# Patient Record
Sex: Female | Born: 1950 | Race: White | Hispanic: No | Smoking: Former smoker
Health system: Southern US, Community
[De-identification: ages and names within clinical notes are randomized; demographics above are authoritative.]

## PROBLEM LIST (undated history)

## (undated) DIAGNOSIS — Z780 Asymptomatic menopausal state: Secondary | ICD-10-CM

## (undated) DIAGNOSIS — F419 Anxiety disorder, unspecified: Secondary | ICD-10-CM

## (undated) DIAGNOSIS — F988 Other specified behavioral and emotional disorders with onset usually occurring in childhood and adolescence: Secondary | ICD-10-CM

## (undated) DIAGNOSIS — K219 Gastro-esophageal reflux disease without esophagitis: Secondary | ICD-10-CM

## (undated) DIAGNOSIS — F909 Attention-deficit hyperactivity disorder, unspecified type: Secondary | ICD-10-CM

## (undated) DIAGNOSIS — E785 Hyperlipidemia, unspecified: Secondary | ICD-10-CM

## (undated) DIAGNOSIS — I639 Cerebral infarction, unspecified: Secondary | ICD-10-CM

## (undated) HISTORY — PX: OTHER SURGICAL HISTORY: SHX169

## (undated) HISTORY — DX: Attention-deficit hyperactivity disorder, unspecified type: F90.9

## (undated) HISTORY — PX: WRIST SURGERY: SHX841

## (undated) HISTORY — DX: Anxiety disorder, unspecified: F41.9

## (undated) HISTORY — DX: Cerebral infarction, unspecified: I63.9

## (undated) HISTORY — DX: Gastro-esophageal reflux disease without esophagitis: K21.9

## (undated) HISTORY — DX: Asymptomatic menopausal state: Z78.0

## (undated) HISTORY — DX: Hyperlipidemia, unspecified: E78.5

## (undated) HISTORY — PX: KNEE ARTHROSCOPY: SHX127

## (undated) HISTORY — DX: Other specified behavioral and emotional disorders with onset usually occurring in childhood and adolescence: F98.8

---

## 1991-11-24 HISTORY — PX: GALLBLADDER SURGERY: SHX652

## 2001-05-24 ENCOUNTER — Other Ambulatory Visit: Admission: RE | Admit: 2001-05-24 | Discharge: 2001-05-24 | Payer: Self-pay | Admitting: Obstetrics and Gynecology

## 2002-08-16 ENCOUNTER — Other Ambulatory Visit: Admission: RE | Admit: 2002-08-16 | Discharge: 2002-08-16 | Payer: Self-pay | Admitting: Obstetrics and Gynecology

## 2003-10-10 ENCOUNTER — Other Ambulatory Visit: Admission: RE | Admit: 2003-10-10 | Discharge: 2003-10-10 | Payer: Self-pay | Admitting: Obstetrics and Gynecology

## 2005-06-30 ENCOUNTER — Other Ambulatory Visit: Admission: RE | Admit: 2005-06-30 | Discharge: 2005-06-30 | Payer: Self-pay | Admitting: Obstetrics and Gynecology

## 2005-09-16 ENCOUNTER — Encounter: Admission: RE | Admit: 2005-09-16 | Discharge: 2005-09-16 | Payer: Self-pay | Admitting: Family Medicine

## 2006-07-02 ENCOUNTER — Other Ambulatory Visit: Admission: RE | Admit: 2006-07-02 | Discharge: 2006-07-02 | Payer: Self-pay | Admitting: Obstetrics and Gynecology

## 2007-02-10 ENCOUNTER — Ambulatory Visit: Payer: Self-pay | Admitting: Vascular Surgery

## 2007-02-10 ENCOUNTER — Encounter: Payer: Self-pay | Admitting: Vascular Surgery

## 2007-02-10 ENCOUNTER — Ambulatory Visit: Admission: RE | Admit: 2007-02-10 | Discharge: 2007-02-10 | Payer: Self-pay | Admitting: Family Medicine

## 2007-03-15 ENCOUNTER — Ambulatory Visit (HOSPITAL_COMMUNITY): Admission: RE | Admit: 2007-03-15 | Discharge: 2007-03-15 | Payer: Self-pay | Admitting: Family Medicine

## 2007-03-15 ENCOUNTER — Encounter: Payer: Self-pay | Admitting: Vascular Surgery

## 2008-08-16 ENCOUNTER — Emergency Department (HOSPITAL_BASED_OUTPATIENT_CLINIC_OR_DEPARTMENT_OTHER): Admission: EM | Admit: 2008-08-16 | Discharge: 2008-08-16 | Payer: Self-pay | Admitting: Emergency Medicine

## 2008-11-23 HISTORY — PX: MOUTH SURGERY: SHX715

## 2009-04-07 ENCOUNTER — Emergency Department (HOSPITAL_BASED_OUTPATIENT_CLINIC_OR_DEPARTMENT_OTHER): Admission: EM | Admit: 2009-04-07 | Discharge: 2009-04-07 | Payer: Self-pay | Admitting: Emergency Medicine

## 2009-06-03 ENCOUNTER — Encounter: Admission: RE | Admit: 2009-06-03 | Discharge: 2009-06-03 | Payer: Self-pay | Admitting: Sports Medicine

## 2009-06-25 ENCOUNTER — Ambulatory Visit (HOSPITAL_COMMUNITY): Admission: RE | Admit: 2009-06-25 | Discharge: 2009-06-25 | Payer: Self-pay | Admitting: Orthopedic Surgery

## 2009-12-31 ENCOUNTER — Other Ambulatory Visit: Admission: RE | Admit: 2009-12-31 | Discharge: 2009-12-31 | Payer: Self-pay | Admitting: Family Medicine

## 2010-11-23 HISTORY — PX: FOOT SURGERY: SHX648

## 2012-05-30 ENCOUNTER — Ambulatory Visit: Payer: Self-pay | Admitting: Internal Medicine

## 2012-06-01 ENCOUNTER — Encounter: Payer: Self-pay | Admitting: Internal Medicine

## 2012-06-01 ENCOUNTER — Other Ambulatory Visit: Payer: Self-pay | Admitting: Internal Medicine

## 2012-06-01 ENCOUNTER — Ambulatory Visit (INDEPENDENT_AMBULATORY_CARE_PROVIDER_SITE_OTHER): Payer: 59 | Admitting: Internal Medicine

## 2012-06-01 VITALS — BP 138/88 | HR 80 | Temp 98.7°F | Resp 16 | Ht 65.0 in | Wt 204.0 lb

## 2012-06-01 DIAGNOSIS — F411 Generalized anxiety disorder: Secondary | ICD-10-CM

## 2012-06-01 DIAGNOSIS — I8390 Asymptomatic varicose veins of unspecified lower extremity: Secondary | ICD-10-CM | POA: Insufficient documentation

## 2012-06-01 DIAGNOSIS — Z780 Asymptomatic menopausal state: Secondary | ICD-10-CM

## 2012-06-01 DIAGNOSIS — E785 Hyperlipidemia, unspecified: Secondary | ICD-10-CM | POA: Insufficient documentation

## 2012-06-01 DIAGNOSIS — F419 Anxiety disorder, unspecified: Secondary | ICD-10-CM

## 2012-06-01 DIAGNOSIS — K219 Gastro-esophageal reflux disease without esophagitis: Secondary | ICD-10-CM

## 2012-06-01 DIAGNOSIS — E119 Type 2 diabetes mellitus without complications: Secondary | ICD-10-CM

## 2012-06-01 DIAGNOSIS — E139 Other specified diabetes mellitus without complications: Secondary | ICD-10-CM

## 2012-06-01 DIAGNOSIS — M1710 Unilateral primary osteoarthritis, unspecified knee: Secondary | ICD-10-CM

## 2012-06-01 LAB — LIPID PANEL
Cholesterol: 238 mg/dL — ABNORMAL HIGH (ref 0–200)
HDL: 37 mg/dL — ABNORMAL LOW (ref >39)
LDL Cholesterol: 172 mg/dL — ABNORMAL HIGH (ref 0–99)
Total CHOL/HDL Ratio: 6.4 Ratio
Triglycerides: 147 mg/dL (ref <150)
VLDL: 29 mg/dL (ref 0–40)

## 2012-06-01 LAB — CBC WITH DIFFERENTIAL/PLATELET
Basophils Absolute: 0.1 10*3/uL (ref 0.0–0.1)
Basophils Relative: 1 % (ref 0–1)
Eosinophils Absolute: 0.2 10*3/uL (ref 0.0–0.7)
Eosinophils Relative: 3 % (ref 0–5)
HCT: 41.5 % (ref 36.0–46.0)
Hemoglobin: 14.2 g/dL (ref 12.0–15.0)
Lymphocytes Relative: 33 % (ref 12–46)
Lymphs Abs: 2 10*3/uL (ref 0.7–4.0)
MCH: 28.7 pg (ref 26.0–34.0)
MCHC: 34.2 g/dL (ref 30.0–36.0)
MCV: 84 fL (ref 78.0–100.0)
Monocytes Absolute: 0.5 10*3/uL (ref 0.1–1.0)
Monocytes Relative: 7 % (ref 3–12)
Neutro Abs: 3.5 10*3/uL (ref 1.7–7.7)
Neutrophils Relative %: 56 % (ref 43–77)
Platelets: 307 10*3/uL (ref 150–400)
RBC: 4.94 MIL/uL (ref 3.87–5.11)
RDW: 13.9 % (ref 11.5–15.5)
WBC: 6.2 10*3/uL (ref 4.0–10.5)

## 2012-06-01 LAB — COMPLETE METABOLIC PANEL WITH GFR
ALT: 18 U/L (ref 0–35)
AST: 24 U/L (ref 0–37)
Albumin: 4 g/dL (ref 3.5–5.2)
Alkaline Phosphatase: 55 U/L (ref 39–117)
BUN: 11 mg/dL (ref 6–23)
CO2: 25 mEq/L (ref 19–32)
Calcium: 9.1 mg/dL (ref 8.4–10.5)
Chloride: 105 mEq/L (ref 96–112)
Creat: 0.94 mg/dL (ref 0.50–1.10)
GFR, Est African American: 77 mL/min
GFR, Est Non African American: 67 mL/min
Glucose, Bld: 174 mg/dL — ABNORMAL HIGH (ref 70–99)
Potassium: 4.2 mEq/L (ref 3.5–5.3)
Sodium: 139 mEq/L (ref 135–145)
Total Bilirubin: 0.3 mg/dL (ref 0.3–1.2)
Total Protein: 7.2 g/dL (ref 6.0–8.3)

## 2012-06-01 LAB — TSH: TSH: 1.204 u[IU]/mL (ref 0.350–4.500)

## 2012-06-01 NOTE — Patient Instructions (Addendum)
Dr. Caffie Pinto podiatrist  On [address]  Labs will be mailed to you

## 2012-06-01 NOTE — Progress Notes (Signed)
  Subjective:    Patient ID: Wendy Underwood, female    DOB: 1951-10-25, 61 y.o.   MRN: [id number]  HPI New pt. Here to establish prinary care.  PMH of anxiety/depression, ADD diagnosed by Dr. Candis Schatz, recent DX of diabetes type 2, Gerd and hyperlipidemia.  She is undergoing study for DM so does not know what med she is on or placebo    She is wondering if she is a good candidate for hormone therapy and states she is "foggy"  She has seen two alternative MD's in past for hormones.  She also went to [facility] for hormone pellets and was told her "Blood count was high and she should go donate blood and not tell them why she needs to donate"  Only had one hormone pellet.    She is concerned over hair loss and dark hairs on chin.  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  Constitutional: She is oriented to person, place, and time. She appears well-developed and well-nourished.  HENT:  Head: Normocephalic and atraumatic.  Cardiovascular: Normal rate and regular rhythm. Exam reveals no gallop and no friction rub.  No murmur heard.  Pulmonary/Chest: Breath sounds normal. She has no wheezes. She has no rales.  Neurological: She is alert and oriented to person, place, and time.  Skin: Skin is warm and dry.  Psychiatric: She has a normal mood and affect. Her behavior is normal.              Assessment & Plan:  DM  willl   Check AIC today.  And chemistries  Hyperlipidemia  Check today  Hair changes.  GAve number to dermatologist  Menopause   ADvised she is not a good candidate for HT  Hair changes are not an indication and she has had DVT x2 per her report.  Anxiety/depression/ADD work stress all  reason for "fogginess"  Advised to discuss with psychiatrist and therapist

## 2012-06-02 ENCOUNTER — Telehealth: Payer: Self-pay | Admitting: *Deleted

## 2012-06-02 LAB — HEMOGLOBIN A1C
Hgb A1c MFr Bld: 7.3 % — ABNORMAL HIGH (ref <5.7)
Mean Plasma Glucose: 163 mg/dL — ABNORMAL HIGH (ref <117)

## 2012-06-02 LAB — VITAMIN D 25 HYDROXY (VIT D DEFICIENCY, FRACTURES): Vit D, 25-Hydroxy: 28 ng/mL — ABNORMAL LOW (ref 30–89)

## 2012-06-02 NOTE — Telephone Encounter (Signed)
Copy of labs mailed to pt's home address along with an appt with Dr Coralyn Mark for Aug.

## 2012-07-04 ENCOUNTER — Encounter: Payer: Self-pay | Admitting: Internal Medicine

## 2012-07-04 ENCOUNTER — Ambulatory Visit (INDEPENDENT_AMBULATORY_CARE_PROVIDER_SITE_OTHER): Payer: 59 | Admitting: Internal Medicine

## 2012-07-04 VITALS — BP 136/90 | HR 92 | Temp 97.0°F | Resp 16 | Ht 65.0 in | Wt 204.0 lb

## 2012-07-04 DIAGNOSIS — Z86718 Personal history of other venous thrombosis and embolism: Secondary | ICD-10-CM

## 2012-07-04 DIAGNOSIS — R739 Hyperglycemia, unspecified: Secondary | ICD-10-CM

## 2012-07-04 DIAGNOSIS — R7309 Other abnormal glucose: Secondary | ICD-10-CM

## 2012-07-04 LAB — POCT CBG (FASTING - GLUCOSE)-MANUAL ENTRY: Glucose Fasting, POC: 256 mg/dL — AB (ref 70–99)

## 2012-07-04 NOTE — Progress Notes (Signed)
Subjective:    Patient ID: Wendy Underwood, female    DOB: [birth year], 61 y.o.   MRN: [id number]  HPI  [name] is here for return  See labs.  She has polyuria during day only.  Some polyphagia.  She is still involved with study with [employer]  She reports she is intolerant of statins had tried two different kinds and had muslce aches in her legs. Niacin intolerance due to skin rash and flushing    No Known Allergies Past Medical History  Diagnosis Date  . Anxiety   . Diabetes mellitus   . GERD (gastroesophageal reflux disease)   . Hyperlipidemia   . Menopause   . ADD (attention deficit disorder)   . ADHD (attention deficit hyperactivity disorder)    Past Surgical History  Procedure Date  . Mouth surgery 2010  . Gallbladder surgery 1993  . Varicose veins 2011and 1991  . Knee arthroscopy 2008 and 2009    both knees  . Wrist surgery     cyst removal  . Foot surgery 2012   History   Social History  . Marital Status: Divorced    Spouse Name: N/A    Number of Children: N/A  . Years of Education: N/A   Occupational History  . Not on file.   Social History Main Topics  . Smoking status: Former Smoker -- 10.5 packs/day for 30 years    Types: Cigarettes    Quit date: 06/02/2007  . Smokeless tobacco: Never Used  . Alcohol Use: No  . Drug Use: No  . Sexually Active: No   Other Topics Concern  . Not on file   Social History Narrative  . No narrative on file   Family History  Problem Relation Age of Onset  . Heart attack Maternal Grandfather   . Heart attack Father   . Cancer Mother     skin  . Cancer Maternal Uncle     skin  . Emphysema Maternal Aunt   . Schizophrenia Mother   . Depression Mother   . ADD / ADHD Son   . Diabetes Mother   . Hypertension Mother   . Hypertension Maternal Grandfather    Patient Active Problem List  Diagnosis  . Anxiety  . Diabetes 1.5, managed as type 1  . GERD (gastroesophageal reflux disease)  . Other and unspecified  hyperlipidemia  . Menopause  . Varicose veins  . DJD (degenerative joint disease) of knee   Current Outpatient Prescriptions on File Prior to Visit  Medication Sig Dispense Refill  . ALPRAZolam (XANAX) 0.25 MG tablet       . amphetamine-dextroamphetamine (ADDERALL) 10 MG tablet       . buPROPion (WELLBUTRIN SR) 150 MG 12 hr tablet       . Multiple Vitamin (MULTIVITAMIN) tablet Take 1 tablet by mouth daily.      . pantoprazole (PROTONIX) 40 MG tablet       . VITAMIN D, CHOLECALCIFEROL, PO Take by mouth.      . zolpidem (AMBIEN) 10 MG tablet           Review of Systems    see HPI Objective:   Physical Exam Physical Exam  Nursing note and vitals reviewed.  Constitutional: She is oriented to person, place, and time. She appears well-developed and well-nourished.  HENT:  Head: Normocephalic and atraumatic.  Cardiovascular: Normal rate and regular rhythm. Exam reveals no gallop and no friction rub.  No murmur heard.  Pulmonary/Chest: Breath sounds normal. She  has no wheezes. She has no rales.  Neurological: She is alert and oriented to person, place, and time.  Skin: Skin is warm and dry.  Psychiatric: She has a normal mood and affect. Her behavior is normal.  Ext no edema          Assessment & Plan:  Diabetes:    I would like to start pt on Metformin today but she declines as she has two weeks of study to complete.   Will get fasting level and AIC  She is to see me in 3 days and I counseled I will need to put her on a med then.    Hyperlipidemia  Intolerant  Of statins will refer to endocrinology  History of DVT x 2  Menopause

## 2012-07-04 NOTE — Patient Instructions (Addendum)
Have fasting labs done  See me on Thursday

## 2012-07-06 LAB — HEMOGLOBIN A1C
Hgb A1c MFr Bld: 7.1 % — ABNORMAL HIGH (ref <5.7)
Mean Plasma Glucose: 157 mg/dL — ABNORMAL HIGH (ref <117)

## 2012-07-07 ENCOUNTER — Ambulatory Visit (INDEPENDENT_AMBULATORY_CARE_PROVIDER_SITE_OTHER): Payer: 59 | Admitting: Internal Medicine

## 2012-07-07 ENCOUNTER — Encounter: Payer: Self-pay | Admitting: Internal Medicine

## 2012-07-07 VITALS — BP 136/70 | HR 91 | Temp 97.0°F | Resp 16 | Ht 65.0 in | Wt 206.0 lb

## 2012-07-07 DIAGNOSIS — E669 Obesity, unspecified: Secondary | ICD-10-CM

## 2012-07-07 DIAGNOSIS — E119 Type 2 diabetes mellitus without complications: Secondary | ICD-10-CM

## 2012-07-07 DIAGNOSIS — E139 Other specified diabetes mellitus without complications: Secondary | ICD-10-CM

## 2012-07-07 MED ORDER — METFORMIN HCL ER (MOD) 500 MG PO TB24: 500.0000 mg | ORAL_TABLET | Freq: Every day | ORAL | Status: DC

## 2012-07-07 NOTE — Patient Instructions (Addendum)
Check glucose before breakfast and before bedtime and bring to next visit

## 2012-07-07 NOTE — Progress Notes (Signed)
Subjective:    Patient ID: Wendy Underwood, female    DOB: 1951-05-07, 61 y.o.   MRN: [id number]  HPI Jennelle is here for follow up of new onset diabetes.  See AIC.    Long counseling session regarding etiology of DM, and signs and symptoms of high and low blood sugar.  See my office note 2 days ago  "I am a sweetaholic"  No Known Allergies Past Medical History  Diagnosis Date  . Anxiety   . Diabetes mellitus   . GERD (gastroesophageal reflux disease)   . Hyperlipidemia   . Menopause   . ADD (attention deficit disorder)   . ADHD (attention deficit hyperactivity disorder)    Past Surgical History  Procedure Date  . Mouth surgery 2010  . Gallbladder surgery 1993  . Varicose veins 2011and 1991  . Knee arthroscopy 2008 and 2009    both knees  . Wrist surgery     cyst removal  . Foot surgery 2012   History   Social History  . Marital Status: Divorced    Spouse Name: N/A    Number of Children: N/A  . Years of Education: N/A   Occupational History  . Not on file.   Social History Main Topics  . Smoking status: Former Smoker -- 10.5 packs/day for 30 years    Types: Cigarettes    Quit date: 06/02/2007  . Smokeless tobacco: Never Used  . Alcohol Use: No  . Drug Use: No  . Sexually Active: No   Other Topics Concern  . Not on file   Social History Narrative  . No narrative on file   Family History  Problem Relation Age of Onset  . Heart attack Maternal Grandfather   . Heart attack Father   . Cancer Mother     skin  . Cancer Maternal Uncle     skin  . Emphysema Maternal Aunt   . Schizophrenia Mother   . Depression Mother   . ADD / ADHD Son   . Diabetes Mother   . Hypertension Mother   . Hypertension Maternal Grandfather    Patient Active Problem List  Diagnosis  . Anxiety  . GERD (gastroesophageal reflux disease)  . Other and unspecified hyperlipidemia  . Menopause  . Varicose veins  . DJD (degenerative joint disease) of knee  . Personal history  of DVT (deep vein thrombosis)  . Diabetes 1.5, managed as type 2   Current Outpatient Prescriptions on File Prior to Visit  Medication Sig Dispense Refill  . ALPRAZolam (XANAX) 0.25 MG tablet       . amphetamine-dextroamphetamine (ADDERALL) 10 MG tablet       . buPROPion (WELLBUTRIN SR) 150 MG 12 hr tablet       . Multiple Vitamin (MULTIVITAMIN) tablet Take 1 tablet by mouth daily.      . pantoprazole (PROTONIX) 40 MG tablet       . VITAMIN D, CHOLECALCIFEROL, PO Take by mouth.      . zolpidem (AMBIEN) 10 MG tablet       . metFORMIN (GLUMETZA) 500 MG (MOD) 24 hr tablet Take 1 tablet (500 mg total) by mouth daily with breakfast.  90 tablet  1      Review of Systems    see HPI Objective:   Physical Exam Physical Exam  Nursing note and vitals reviewed.  Constitutional: She is oriented to person, place, and time. She appears well-developed and well-nourished.  HENT:  Head: Normocephalic and atraumatic.  Cardiovascular:  Normal rate and regular rhythm. Exam reveals no gallop and no friction rub.  No murmur heard.  Pulmonary/Chest: Breath sounds normal. She has no wheezes. She has no rales.  Neurological: She is alert and oriented to person, place, and time.  Skin: Skin is warm and dry.  Psychiatric: She has a normal mood and affect. Her behavior is normal.              Assessment & Plan:  New onset diabetes type II  Will initiate Metformin ER 500 mg in am.  Given handout of signs to look for with low sugar.  Refer to Diabetes Education  Obesity  enocurage low carb low fat diet  I spent 30 minutes with pt  Over 50% in counseling  See ne 4-6 weeks

## 2012-07-14 ENCOUNTER — Other Ambulatory Visit: Payer: Self-pay | Admitting: *Deleted

## 2012-07-14 MED ORDER — METFORMIN HCL ER (MOD) 500 MG PO TB24: 500.0000 mg | ORAL_TABLET | Freq: Every day | ORAL | Status: DC

## 2012-07-18 ENCOUNTER — Other Ambulatory Visit: Payer: Self-pay | Admitting: *Deleted

## 2012-07-18 MED ORDER — METFORMIN HCL ER (MOD) 500 MG PO TB24: 500.0000 mg | ORAL_TABLET | Freq: Every day | ORAL | Status: DC

## 2012-08-03 ENCOUNTER — Other Ambulatory Visit: Payer: Self-pay | Admitting: Internal Medicine

## 2012-08-03 NOTE — Telephone Encounter (Signed)
Pt needs her authorization to e scripts of MetFORMIN HCl (Tablet SR 24 hr) GLUMETZA 500 MG (MOD) Take 1 tablet (500 mg total) by mouth daily with breakfast.  She states that they have touch bases with [place] and are waiting on [place] to respond. Pt states she is almost out of her meds that she got from the pharmacy a month ago... Pt ask to call her if there are any questions [phone].Marland KitchenMarland Kitchen

## 2012-08-04 MED ORDER — METFORMIN HCL ER (MOD) 500 MG PO TB24: 500.0000 mg | ORAL_TABLET | Freq: Every day | ORAL | Status: DC

## 2012-08-08 ENCOUNTER — Other Ambulatory Visit: Payer: Self-pay | Admitting: *Deleted

## 2012-08-08 NOTE — Telephone Encounter (Signed)
Wendy Underwood this was done on 9/11 check with pharmacy

## 2012-08-09 ENCOUNTER — Ambulatory Visit: Payer: 59 | Admitting: *Deleted

## 2012-08-10 NOTE — Telephone Encounter (Signed)
Refill request

## 2012-08-16 ENCOUNTER — Ambulatory Visit (INDEPENDENT_AMBULATORY_CARE_PROVIDER_SITE_OTHER): Payer: 59 | Admitting: Internal Medicine

## 2012-08-16 ENCOUNTER — Encounter: Payer: Self-pay | Admitting: Internal Medicine

## 2012-08-16 VITALS — BP 136/84 | HR 106 | Temp 98.1°F | Resp 20 | Wt 206.0 lb

## 2012-08-16 DIAGNOSIS — E139 Other specified diabetes mellitus without complications: Secondary | ICD-10-CM

## 2012-08-16 DIAGNOSIS — E119 Type 2 diabetes mellitus without complications: Secondary | ICD-10-CM

## 2012-08-16 DIAGNOSIS — Z86718 Personal history of other venous thrombosis and embolism: Secondary | ICD-10-CM

## 2012-08-16 DIAGNOSIS — E785 Hyperlipidemia, unspecified: Secondary | ICD-10-CM

## 2012-08-16 MED ORDER — METFORMIN HCL ER (MOD) 500 MG PO TB24: 500.0000 mg | ORAL_TABLET | Freq: Every day | ORAL | Status: DC

## 2012-08-16 NOTE — Progress Notes (Signed)
Subjective:    Patient ID: Wendy Underwood, female    DOB: 08-08-1951, 61 y.o.   MRN: [id number]  HPI Wendy Underwood is here for follow up after initiating [place].  She has not had any in 3-4 days as she has had problems with mail order.  She wishes to go to [facility]  Glucoses in am running 140's to 160's.  She has appt next week with dietician  Cannot tolerate statins but has just started niacin again.   No Known Allergies Past Medical History  Diagnosis Date  . Anxiety   . Diabetes mellitus   . GERD (gastroesophageal reflux disease)   . Hyperlipidemia   . Menopause   . ADD (attention deficit disorder)   . ADHD (attention deficit hyperactivity disorder)    Past Surgical History  Procedure Date  . Mouth surgery 2010  . Gallbladder surgery 1993  . Varicose veins 2011and 1991  . Knee arthroscopy 2008 and 2009    both knees  . Wrist surgery     cyst removal  . Foot surgery 2012   History   Social History  . Marital Status: Divorced    Spouse Name: N/A    Number of Children: N/A  . Years of Education: N/A   Occupational History  . Not on file.   Social History Main Topics  . Smoking status: Former Smoker -- 10.5 packs/day for 30 years    Types: Cigarettes    Quit date: 06/02/2007  . Smokeless tobacco: Never Used  . Alcohol Use: No  . Drug Use: No  . Sexually Active: No   Other Topics Concern  . Not on file   Social History Narrative  . No narrative on file   Family History  Problem Relation Age of Onset  . Heart attack Maternal Grandfather   . Heart attack Father   . Cancer Mother     skin  . Cancer Maternal Uncle     skin  . Emphysema Maternal Aunt   . Schizophrenia Mother   . Depression Mother   . ADD / ADHD Son   . Diabetes Mother   . Hypertension Mother   . Hypertension Maternal Grandfather    Patient Active Problem List  Diagnosis  . Anxiety  . GERD (gastroesophageal reflux disease)  . Other and unspecified hyperlipidemia  . Menopause  .  Varicose veins  . DJD (degenerative joint disease) of knee  . Personal history of DVT (deep vein thrombosis)  . Diabetes 1.5, managed as type 2   Current Outpatient Prescriptions on File Prior to Visit  Medication Sig Dispense Refill  . ALPRAZolam (XANAX) 0.25 MG tablet       . amphetamine-dextroamphetamine (ADDERALL) 10 MG tablet       . buPROPion (WELLBUTRIN SR) 150 MG 12 hr tablet       . Multiple Vitamin (MULTIVITAMIN) tablet Take 1 tablet by mouth daily.      . pantoprazole (PROTONIX) 40 MG tablet       . VITAMIN D, CHOLECALCIFEROL, PO Take by mouth.      . zolpidem (AMBIEN) 10 MG tablet       . DISCONTD: metFORMIN (GLUMETZA) 500 MG (MOD) 24 hr tablet Take 1 tablet (500 mg total) by mouth daily with breakfast.  90 tablet  3      Review of Systems See HPI    Objective:   Physical Exam Physical Exam  Nursing note and vitals reviewed.  Constitutional: She is oriented to person, place, and  time. She appears well-developed and well-nourished.  HENT:  Head: Normocephalic and atraumatic.  Cardiovascular: Normal rate and regular rhythm. Exam reveals no gallop and no friction rub.  No murmur heard.  Pulmonary/Chest: Breath sounds normal. She has no wheezes. She has no rales.  Neurological: She is alert and oriented to person, place, and time.  Skin: Skin is warm and dry.  Psychiatric: She has a normal mood and affect. Her behavior is normal.         Assessment & Plan:  DM:  Counseled not to run out of meds.  Nutrition appt pending.  See me 3 months  Hyperlipidemia  Will get DM under control with meds and diet and recheck fasitng levels in December  History of DVT  Diabetes visit 3 months

## 2012-08-16 NOTE — Patient Instructions (Addendum)
See me in 3 months fasting

## 2012-08-18 ENCOUNTER — Ambulatory Visit: Payer: 59 | Admitting: Internal Medicine

## 2012-10-26 NOTE — Progress Notes (Signed)
Was not able to reach until 4pm prior to surgery-will need istat and ekg-had medical clearance from Dr Tamala Julian

## 2012-10-27 ENCOUNTER — Encounter (HOSPITAL_BASED_OUTPATIENT_CLINIC_OR_DEPARTMENT_OTHER): Payer: Self-pay | Admitting: *Deleted

## 2012-10-27 ENCOUNTER — Ambulatory Visit (HOSPITAL_BASED_OUTPATIENT_CLINIC_OR_DEPARTMENT_OTHER): Payer: 59 | Admitting: *Deleted

## 2012-10-27 ENCOUNTER — Encounter (HOSPITAL_BASED_OUTPATIENT_CLINIC_OR_DEPARTMENT_OTHER)
Admission: RE | Disposition: A | Discharge status: Home / Self Care | Payer: Self-pay | Source: Ambulatory Visit | Attending: Orthopedic Surgery

## 2012-10-27 ENCOUNTER — Ambulatory Visit (HOSPITAL_BASED_OUTPATIENT_CLINIC_OR_DEPARTMENT_OTHER)
Admission: RE | Admit: 2012-10-27 | Discharge: 2012-10-27 | Disposition: A | Discharge status: Home / Self Care | Payer: 59 | Source: Ambulatory Visit | Attending: Orthopedic Surgery | Admitting: Orthopedic Surgery

## 2012-10-27 DIAGNOSIS — E119 Type 2 diabetes mellitus without complications: Secondary | ICD-10-CM | POA: Insufficient documentation

## 2012-10-27 DIAGNOSIS — M2040 Other hammer toe(s) (acquired), unspecified foot: Secondary | ICD-10-CM | POA: Insufficient documentation

## 2012-10-27 HISTORY — PX: HAMMERTOE RECONSTRUCTION WITH WEIL OSTEOTOMY: SHX5631

## 2012-10-27 LAB — POCT I-STAT, CHEM 8
BUN: 8 mg/dL (ref 6–23)
Chloride: 110 mEq/L (ref 96–112)
Creatinine, Ser: 0.8 mg/dL (ref 0.50–1.10)
Potassium: 4.2 mEq/L (ref 3.5–5.1)
Sodium: 144 mEq/L (ref 135–145)
TCO2: 24 mmol/L (ref 0–100)

## 2012-10-27 LAB — GLUCOSE, CAPILLARY: Glucose-Capillary: 229 mg/dL — ABNORMAL HIGH (ref 70–99)

## 2012-10-27 SURGERY — HAMMERTOE RECONSTRUCTION WITH WEIL OSTEOTOMY
Anesthesia: Regional | Site: Foot | Laterality: Right | Wound class: Clean

## 2012-10-27 MED ORDER — HYDROMORPHONE HCL PF 1 MG/ML IJ SOLN: 0.2500 mg | INTRAMUSCULAR | Status: DC | PRN

## 2012-10-27 MED ORDER — BUPIVACAINE HCL (PF) 0.5 % IJ SOLN
INTRAMUSCULAR | Status: DC | PRN
  Administered 2012-10-27: 10 mL

## 2012-10-27 MED ORDER — OXYCODONE HCL 5 MG/5ML PO SOLN
5.0000 mg | Freq: Once | ORAL | Status: DC | PRN
Start: 2012-10-27 — End: 2012-10-27

## 2012-10-27 MED ORDER — ACETAMINOPHEN 10 MG/ML IV SOLN
1000.0000 mg | Freq: Once | INTRAVENOUS | Status: AC
  Administered 2012-10-27: 1000 mg via INTRAVENOUS

## 2012-10-27 MED ORDER — OXYCODONE HCL 5 MG PO TABS: 5.0000 mg | ORAL_TABLET | ORAL | Status: DC | PRN

## 2012-10-27 MED ORDER — 0.9 % SODIUM CHLORIDE (POUR BTL) OPTIME
TOPICAL | Status: DC | PRN
  Administered 2012-10-27: 300 mL

## 2012-10-27 MED ORDER — BUPIVACAINE-EPINEPHRINE PF 0.5-1:200000 % IJ SOLN
INTRAMUSCULAR | Status: DC | PRN
  Administered 2012-10-27: 30 mL

## 2012-10-27 MED ORDER — PROPOFOL 10 MG/ML IV BOLUS
INTRAVENOUS | Status: DC | PRN
  Administered 2012-10-27: 200 mg via INTRAVENOUS

## 2012-10-27 MED ORDER — CHLORHEXIDINE GLUCONATE 4 % EX LIQD: 60.0000 mL | Freq: Once | CUTANEOUS | Status: DC

## 2012-10-27 MED ORDER — SODIUM CHLORIDE 0.9 % IV SOLN: INTRAVENOUS | Status: DC

## 2012-10-27 MED ORDER — FENTANYL CITRATE 0.05 MG/ML IJ SOLN
50.0000 ug | INTRAMUSCULAR | Status: DC | PRN
  Administered 2012-10-27: 100 ug via INTRAVENOUS

## 2012-10-27 MED ORDER — MIDAZOLAM HCL 2 MG/2ML IJ SOLN
0.5000 mg | INTRAMUSCULAR | Status: DC | PRN
  Administered 2012-10-27: 2 mg via INTRAVENOUS

## 2012-10-27 MED ORDER — OXYCODONE HCL 5 MG PO TABS: 5.0000 mg | ORAL_TABLET | Freq: Once | ORAL | Status: DC | PRN

## 2012-10-27 MED ORDER — DEXAMETHASONE SODIUM PHOSPHATE 10 MG/ML IJ SOLN
INTRAMUSCULAR | Status: DC | PRN
  Administered 2012-10-27: 5 mg via INTRAVENOUS

## 2012-10-27 MED ORDER — BACITRACIN ZINC 500 UNIT/GM EX OINT
TOPICAL_OINTMENT | CUTANEOUS | Status: DC | PRN
  Administered 2012-10-27: 1 via TOPICAL

## 2012-10-27 MED ORDER — ONDANSETRON HCL 4 MG/2ML IJ SOLN
INTRAMUSCULAR | Status: DC | PRN
  Administered 2012-10-27: 4 mg via INTRAVENOUS

## 2012-10-27 MED ORDER — CEFAZOLIN SODIUM-DEXTROSE 2-3 GM-% IV SOLR
2.0000 g | INTRAVENOUS | Status: AC
  Administered 2012-10-27: 2 g via INTRAVENOUS

## 2012-10-27 MED ORDER — LIDOCAINE HCL (CARDIAC) 20 MG/ML IV SOLN
INTRAVENOUS | Status: DC | PRN
  Administered 2012-10-27: 30 mg via INTRAVENOUS

## 2012-10-27 MED ORDER — LACTATED RINGERS IV SOLN
INTRAVENOUS | Status: DC
  Administered 2012-10-27: 08:00:00 via INTRAVENOUS
  Administered 2012-10-27: 20 mL/h via INTRAVENOUS

## 2012-10-27 SURGICAL SUPPLY — 71 items
BANDAGE CONFORM 2  STR LF (GAUZE/BANDAGES/DRESSINGS) ×3 IMPLANT
BANDAGE CONFORM 3  STR LF (GAUZE/BANDAGES/DRESSINGS) IMPLANT
BANDAGE ESMARK 6X9 LF (GAUZE/BANDAGES/DRESSINGS) ×2 IMPLANT
BLADE AVERAGE 25X9 (BLADE) ×3 IMPLANT
BLADE SURG 15 STRL LF DISP TIS (BLADE) ×6 IMPLANT
BLADE SURG 15 STRL SS (BLADE) ×9
BNDG CMPR 9X4 STRL LF SNTH (GAUZE/BANDAGES/DRESSINGS)
BNDG CMPR 9X6 STRL LF SNTH (GAUZE/BANDAGES/DRESSINGS) ×2
BNDG COHESIVE 4X5 TAN STRL (GAUZE/BANDAGES/DRESSINGS) ×3 IMPLANT
BNDG ESMARK 4X9 LF (GAUZE/BANDAGES/DRESSINGS) IMPLANT
BNDG ESMARK 6X9 LF (GAUZE/BANDAGES/DRESSINGS) ×3
CAP PIN PROTECTOR ORTHO WHT (CAP) ×6 IMPLANT
CHLORAPREP W/TINT 26ML (MISCELLANEOUS) ×3 IMPLANT
CLOTH BEACON ORANGE TIMEOUT ST (SAFETY) ×3 IMPLANT
COVER TABLE BACK 60X90 (DRAPES) ×3 IMPLANT
CUFF TOURNIQUET SINGLE 18IN (TOURNIQUET CUFF) IMPLANT
CUFF TOURNIQUET SINGLE 34IN LL (TOURNIQUET CUFF) ×3 IMPLANT
DRAPE EXTREMITY T 121X128X90 (DRAPE) ×3 IMPLANT
DRAPE OEC MINIVIEW 54X84 (DRAPES) ×3 IMPLANT
DRAPE U-SHAPE 47X51 STRL (DRAPES) ×3 IMPLANT
DRSG EMULSION OIL 3X3 NADH (GAUZE/BANDAGES/DRESSINGS) ×3 IMPLANT
DRSG PAD ABDOMINAL 8X10 ST (GAUZE/BANDAGES/DRESSINGS) IMPLANT
ELECT REM PT RETURN 9FT ADLT (ELECTROSURGICAL) ×3
ELECTRODE REM PT RTRN 9FT ADLT (ELECTROSURGICAL) ×2 IMPLANT
GAUZE SPONGE 4X4 16PLY XRAY LF (GAUZE/BANDAGES/DRESSINGS) IMPLANT
GLOVE BIO SURGEON STRL SZ7 (GLOVE) ×3 IMPLANT
GLOVE BIO SURGEON STRL SZ8 (GLOVE) ×3 IMPLANT
GLOVE BIOGEL PI IND STRL 7.0 (GLOVE) ×2 IMPLANT
GLOVE BIOGEL PI IND STRL 8 (GLOVE) ×2 IMPLANT
GLOVE BIOGEL PI INDICATOR 7.0 (GLOVE) ×1
GLOVE BIOGEL PI INDICATOR 8 (GLOVE) ×1
GLOVE ECLIPSE 6.5 STRL STRAW (GLOVE) ×2 IMPLANT
GOWN PREVENTION PLUS XLARGE (GOWN DISPOSABLE) ×5 IMPLANT
GOWN PREVENTION PLUS XXLARGE (GOWN DISPOSABLE) ×3 IMPLANT
K-WIRE 102X1.4 (WIRE) ×9 IMPLANT
NEEDLE HYPO 22GX1.5 SAFETY (NEEDLE) IMPLANT
NS IRRIG 1000ML POUR BTL (IV SOLUTION) ×3 IMPLANT
PACK BASIN DAY SURGERY FS (CUSTOM PROCEDURE TRAY) ×3 IMPLANT
PAD CAST 4YDX4 CTTN HI CHSV (CAST SUPPLIES) ×2 IMPLANT
PADDING CAST ABS 4INX4YD NS (CAST SUPPLIES) ×1
PADDING CAST ABS COTTON 4X4 ST (CAST SUPPLIES) ×2 IMPLANT
PADDING CAST COTTON 4X4 STRL (CAST SUPPLIES) ×3
PASSER SUT SWANSON 36MM LOOP (INSTRUMENTS) IMPLANT
PENCIL BUTTON HOLSTER BLD 10FT (ELECTRODE) IMPLANT
SHEET MEDIUM DRAPE 40X70 STRL (DRAPES) ×3 IMPLANT
SLEEVE SCD COMPRESS KNEE MED (MISCELLANEOUS) ×3 IMPLANT
SPONGE GAUZE 4X4 12PLY (GAUZE/BANDAGES/DRESSINGS) ×3 IMPLANT
SPONGE LAP 18X18 X RAY DECT (DISPOSABLE) ×3 IMPLANT
STOCKINETTE 6  STRL (DRAPES) ×1
STOCKINETTE 6 STRL (DRAPES) ×2 IMPLANT
SUCTION FRAZIER TIP 10 FR DISP (SUCTIONS) IMPLANT
SUT ETHILON 3 0 FSL (SUTURE) ×3 IMPLANT
SUT ETHILON 3 0 PS 1 (SUTURE) ×3 IMPLANT
SUT MERSILENE 2.0 SH NDLE (SUTURE) IMPLANT
SUT MNCRL AB 3-0 PS2 18 (SUTURE) ×3 IMPLANT
SUT MNCRL AB 4-0 PS2 18 (SUTURE) IMPLANT
SUT VIC AB 2-0 CT1 27 (SUTURE)
SUT VIC AB 2-0 CT1 TAPERPNT 27 (SUTURE) IMPLANT
SUT VIC AB 2-0 SH 18 (SUTURE) IMPLANT
SUT VIC AB 2-0 SH 27 (SUTURE) ×3
SUT VIC AB 2-0 SH 27XBRD (SUTURE) ×2 IMPLANT
SUT VIC AB 3-0 PS1 18 (SUTURE) ×3
SUT VIC AB 3-0 PS1 18XBRD (SUTURE) ×1 IMPLANT
SYR BULB 3OZ (MISCELLANEOUS) ×3 IMPLANT
SYR CONTROL 10ML LL (SYRINGE) IMPLANT
TOWEL OR 17X24 6PK STRL BLUE (TOWEL DISPOSABLE) ×6 IMPLANT
TUBE CONNECTING 20X1/4 (TUBING) IMPLANT
TWIST OFF SCREW 12MM (Screw) ×3 IMPLANT
TWIST OFF SCREW 14MM (Screw) ×3 IMPLANT
UNDERPAD 30X30 INCONTINENT (UNDERPADS AND DIAPERS) ×3 IMPLANT
YANKAUER SUCT BULB TIP NO VENT (SUCTIONS) IMPLANT

## 2012-10-27 NOTE — Anesthesia Preprocedure Evaluation (Addendum)
Anesthesia Evaluation  Patient identified by MRN, date of birth, ID band Patient awake    Reviewed: Allergy & Precautions, H&P , NPO status , Patient's Chart, lab work & pertinent test results  Airway Mallampati: II TM Distance: >3 FB Neck ROM: Full    Dental No notable dental hx. (+) Implants and Dental Advisory Given   Pulmonary neg pulmonary ROS,  breath sounds clear to auscultation  Pulmonary exam normal       Cardiovascular negative cardio ROS  Rhythm:Regular Rate:Normal     Neuro/Psych PSYCHIATRIC DISORDERS negative neurological ROS     GI/Hepatic Neg liver ROS, GERD-  Controlled,  Endo/Other  diabetes, Type 2, Oral Hypoglycemic Agents  Renal/GU negative Renal ROS  negative genitourinary   Musculoskeletal   Abdominal   Peds  Hematology negative hematology ROS (+)   Anesthesia Other Findings   Reproductive/Obstetrics negative OB ROS                           Anesthesia Physical Anesthesia Plan  ASA: II  Anesthesia Plan: General and Regional   Post-op Pain Management:    Induction: Intravenous  Airway Management Planned: LMA  Additional Equipment:   Intra-op Plan:   Post-operative Plan: Extubation in OR  Informed Consent: I have reviewed the patients History and Physical, chart, labs and discussed the procedure including the risks, benefits and alternatives for the proposed anesthesia with the patient or authorized representative who has indicated his/her understanding and acceptance.   Dental advisory given  Plan Discussed with: CRNA  Anesthesia Plan Comments:         Anesthesia Quick Evaluation

## 2012-10-27 NOTE — Transfer of Care (Signed)
Immediate Anesthesia Transfer of Care Note  Patient: Wendy Underwood  Procedure(s) Performed: Procedure(s) (LRB) with comments: HAMMERTOE RECONSTRUCTION WITH WEIL OSTEOTOMY (Right) - Right Second, Third and Fouth  Hammertoe Correction; Right Second and Third Metatarsal Weil Osteotomy; Second and Third Dorsal Capsulotomy;  Second Flexor Extensor Transfer  Patient Location: PACU  Anesthesia Type:GA combined with regional for post-op pain  Level of Consciousness: awake and sedated  Airway & Oxygen Therapy: Patient Spontanous Breathing and Patient connected to face mask oxygen  Post-op Assessment: Report given to PACU RN, Post -op Vital signs reviewed and stable and Patient moving all extremities  Post vital signs: Reviewed and stable  Complications: No apparent anesthesia complications

## 2012-10-27 NOTE — Anesthesia Postprocedure Evaluation (Signed)
  Anesthesia Post-op Note  Patient: Wendy Underwood  Procedure(s) Performed: Procedure(s) (LRB) with comments: HAMMERTOE RECONSTRUCTION WITH WEIL OSTEOTOMY (Right) - Right Second, Third and Fouth  Hammertoe Correction; Right Second and Third Metatarsal Weil Osteotomy; Second and Third Dorsal Capsulotomy;  Second Flexor Extensor Transfer  Patient Location: PACU  Anesthesia Type:GA combined with regional for post-op pain  Level of Consciousness: awake and alert   Airway and Oxygen Therapy: Patient Spontanous Breathing and Patient connected to face mask oxygen  Post-op Pain: none  Post-op Assessment: Post-op Vital signs reviewed, Patient's Cardiovascular Status Stable, Respiratory Function Stable, Patent Airway and No signs of Nausea or vomiting  Post-op Vital Signs: Reviewed and stable  Complications: No apparent anesthesia complications

## 2012-10-27 NOTE — H&P (Signed)
Wendy Underwood is an 61 y.o. female.   Chief Complaint: hammertoes HPI: 61 y/o female with painful right hammertoes 2-4.  She presents now for operative treatment.  Past Medical History  Diagnosis Date  . Anxiety   . Diabetes mellitus   . GERD (gastroesophageal reflux disease)   . Hyperlipidemia   . Menopause   . ADD (attention deficit disorder)   . ADHD (attention deficit hyperactivity disorder)     Past Surgical History  Procedure Date  . Mouth surgery 2010  . Gallbladder surgery 1993  . Varicose veins 2011and 1991  . Knee arthroscopy 2008 and 2009    both knees  . Wrist surgery     cyst removal  . Foot surgery 2012    Family History  Problem Relation Age of Onset  . Heart attack Maternal Grandfather   . Heart attack Father   . Cancer Mother     skin  . Cancer Maternal Uncle     skin  . Emphysema Maternal Aunt   . Schizophrenia Mother   . Depression Mother   . ADD / ADHD Son   . Diabetes Mother   . Hypertension Mother   . Hypertension Maternal Grandfather    Social History:  reports that she quit smoking about 5 years ago. Her smoking use included Cigarettes. She has a 315 pack-year smoking history. She has never used smokeless tobacco. She reports that she does not drink alcohol or use illicit drugs.  Allergies: No Known Allergies  Medications Prior to Admission  Medication Sig Dispense Refill  . ALPRAZolam (XANAX) 0.25 MG tablet       . amphetamine-dextroamphetamine (ADDERALL) 10 MG tablet       . buPROPion (WELLBUTRIN SR) 150 MG 12 hr tablet       . colesevelam (WELCHOL) 625 MG tablet Take 1,875 mg by mouth 2 (two) times daily with a meal.      . metFORMIN (GLUMETZA) 500 MG (MOD) 24 hr tablet Take 1 tablet (500 mg total) by mouth daily with breakfast.  90 tablet  3  . Multiple Vitamin (MULTIVITAMIN) tablet Take 1 tablet by mouth daily.      . pantoprazole (PROTONIX) 40 MG tablet       . VITAMIN D, CHOLECALCIFEROL, PO Take by mouth.      . zolpidem  (AMBIEN) 10 MG tablet         Results for orders placed during the hospital encounter of 10/27/12 (from the past 48 hour(s))  POCT I-STAT, CHEM 8     Status: Abnormal   Collection Time   10/27/12  7:11 AM      Component Value Range Comment   Sodium 144  135 - 145 mEq/L    Potassium 4.2  3.5 - 5.1 mEq/L    Chloride 110  96 - 112 mEq/L    BUN 8  6 - 23 mg/dL    Creatinine, Ser 0.80  0.50 - 1.10 mg/dL    Glucose, Bld 172 (*) 70 - 99 mg/dL    Calcium, Ion 1.11 (*) 1.12 - 1.23 mmol/L    TCO2 24  0 - 100 mmol/L    Hemoglobin 13.6  12.0 - 15.0 g/dL    HCT 40.0  36.0 - 46.0 %    No results found.  ROS  No recent f/c/nv//wt los  Blood pressure 146/79, pulse 16, temperature 98.1 F (36.7 C), temperature source Oral, resp. rate 16, height 5\' 5"  (1.651 m), weight 92.08 kg (203 lb),  SpO2 99.00%. Physical Exam wn wd woman in nad.  A and O x 4.  Mood anda ffect normal.  EOMI.  Respirations unlabored.  R forefoot with 2-4 hammertoes.  Not passively correctable.  Skin healthy and intact.  Palpable pulses.  Feels LT normally.  No lymphadenopathy.  Assessment/Plan Right 2-4 hammertoes.  To OR for weil osteotomies, dorsal capsulotomies, PIP arthrodeses and possible flexor to extensor transfers.  The risks and benefits of the alternative treatment options have been discussed in detail.  The patient wishes to proceed with surgery and specifically understands risks of bleeding, infection, nerve damage, blood clots, need for additional surgery, amputation and death.   [name] 10/29/12, 7:16 AM

## 2012-10-27 NOTE — Progress Notes (Signed)
Dr Ola Spurr by to assess and check on pt.  Reported Blood sugar of 229 to him. Verbalized acknowledgement. No orders given. Pt stable nad, resting quietly otherwise.

## 2012-10-27 NOTE — Progress Notes (Signed)
Assisted Dr. Ola Spurr with right, ultrasound guided, popliteal block. Side rails up, monitors on throughout procedure. See vital signs in flow sheet. Tolerated Procedure well.

## 2012-10-27 NOTE — Anesthesia Procedure Notes (Addendum)
Anesthesia Regional Block:  Popliteal block  Pre-Anesthetic Checklist: ,, timeout performed, Correct Patient, Correct Site, Correct Laterality, Correct Procedure, Correct Position, site marked, Risks and benefits discussed, pre-op evaluation, post-op pain management  Laterality: Right  Prep: Maximum Sterile Barrier Precautions used and chloraprep       Needles:  Injection technique: Single-shot  Needle Type: Echogenic Stimulator Needle          Additional Needles:  Procedures: ultrasound guided (picture in chart) and nerve stimulator Popliteal block  Nerve Stimulator or Paresthesia:  Response: Peroneal, 0.4 mA,  Response: Tibial, 0.4 mA,   Additional Responses:   Narrative:  Start time: 10/27/2012 7:15 AM End time: 10/27/2012 7:25 AM Injection made incrementally with aspirations every 5 mL. Anesthesiologist: Ola Spurr, MD  Additional Notes: 2% Lidocaine skin wheel. Saphenous block with 5cc of 0.5% Bupivicaine plain.  Popliteal block Procedure Name: LMA Insertion Date/Time: 10/27/2012 7:39 AM Performed by: [place] Pre-anesthesia Checklist: Patient identified, Emergency Drugs available, Suction available and Patient being monitored Patient Re-evaluated:Patient Re-evaluated prior to inductionOxygen Delivery Method: Circle system utilized Preoxygenation: Pre-oxygenation with 100% oxygen Intubation Type: IV induction Ventilation: Mask ventilation without difficulty LMA: LMA inserted LMA Size: 4.0 Number of attempts: 1 Airway Equipment and Method: Bite block Placement Confirmation: breath sounds checked- equal and bilateral Tube secured with: Tape

## 2012-10-27 NOTE — Brief Op Note (Signed)
10/27/2012  9:14 AM  PATIENT:  Wendy Underwood  61 y.o. female  PRE-OPERATIVE DIAGNOSIS:  Right Second, Third and Fourth Hammertoes  POST-OPERATIVE DIAGNOSIS:  Right Second claw toe, Third and Fourth Hammertoes  Procedure(s): 1.  Right 2nd and 3rd metatarsal Weil osteotomies 2.  Right 2nd and 3rd dorsal capsulotomies with EDL and EDB lengthening 3.  Right 2nd, 3rd and 4th hammertoe corrections (PIP arthrodeses) 4.  Right 2nd flexor to extensor transfer 5.  fluoro  SURGEON:  [name], MD  ASSISTANT: n/a  ANESTHESIA:   General, regional  EBL:  minimal   TOURNIQUET:   Total Tourniquet Time Documented: Thigh (Right) - 77 minutes  COMPLICATIONS:  None apparent  DISPOSITION:  Extubated, awake and stable to recovery.  DICTATION ID:   [id number]

## 2012-10-28 NOTE — Op Note (Signed)
Wendy Underwood, Wendy Underwood            ACCOUNT NO.:  [id number]  MEDICAL RECORD NO.:  [id number]  LOCATION:                                 FACILITY:  PHYSICIAN:  [name], MD        DATE OF BIRTH:  1951/06/24  DATE OF PROCEDURE:  10/27/2012 DATE OF DISCHARGE:                              OPERATIVE REPORT   PREOPERATIVE DIAGNOSIS:  Right second, third, and fourth hammertoes.  POSTOPERATIVE DIAGNOSIS:  Right second claw toe, and third and fourth hammertoes.  PROCEDURES: 1. Right second and third metatarsal Weil osteotomies. 2. Right second and third dorsal capsulotomies with extensor digitorum     longus and extensor digitorum brevis lengthening. 3. Right second, third and fourth hammertoe corrections (PIP     arthrodesis). 4. Right second toe flexor to extensor transfer. 5. Intraoperative interpretation of fluoroscopic imaging.  SURGEON:  [name], MD  ANESTHESIA:  General, regional.  ESTIMATED BLOOD LOSS:  Minimal.  TOURNIQUET TIME:  77 minutes at 250 mmHg.  COMPLICATIONS:  None apparent.  DISPOSITION:  Extubated, awake and stable to recovery.  INDICATIONS FOR PROCEDURE:  The patient is a 61 year old woman with a painful right forefoot deformity of second, third, and fourth hammertoes.  She presents now for operative treatment of this condition, having failed nonoperative treatment including activity modification, shoe wear modification, toe spacers, oral anti-inflammatories, and previous Morton's neuroma resection.  She understands the risks and benefits, the alternative treatment options, and elects surgical treatment.  She specifically understands risks of bleeding, infection, nerve damage, blood clots, need for additional surgery, amputation, and death.  PROCEDURE IN DETAIL:  After preoperative consent was obtained and the correct operative site was identified, the patient was brought to the operating room and placed supine on the operating table.   General anesthesia was induced.  Preoperative antibiotics were administered. Surgical time-out was taken.  The right lower extremity was prepped and draped in standard sterile fashion with tourniquet around the thigh. The extremity was exsanguinated and the tourniquet was inflated to 250 mmHg.  The dorsal incisions were marked on the skin at the second MTP joint and the third webspace.  Sharp dissection was carried down through the skin and subcutaneous tissue over the second MTP joint.  The extensor digitorum longus and extensor digitorum brevis tendons were lengthened.  The dorsal joint capsule was excised in its entirety.  The MP joint was noted to be dislocated with the proximal phalanx overriding the dorsal aspect of the second metatarsal head.  Collateral ligaments were released allowing the toe to be passively reduced.  The more lateral incision was also made and sharp dissection was carried down through the skin and subcutaneous tissue.  The fourth toe was noted to be passively correctable, the third toe was not, so again the EDL and EDB tendons were lengthened and the dorsal joint capsule was excised in its entirety.  A Weil osteotomy was then made at the metatarsal head. The head was allowed to retract proximally.  The osteotomy was fixed with a partially threaded cancellous screw.  The overhanging bone was trimmed off with a rongeur.  This procedure was repeated for the second toe at the MTP joint.  Attention was then turned to the PIP joint at the second toe.  The longitudinal incision was extended down over the proximal phalanx.  The head of the proximal phalanx was exposed and resected with an oscillating saw.  The base of the middle phalanx was also exposed and resected with the oscillating saw.  Transverse incisions were then made over the third and fourth toes at the PIP joints.  The head of the metatarsals were resected with an oscillating saw followed by the bases of  the middle phalanges.  K-wires were inserted antegrade out through the tips of the toes at 3 and 4, and then retrograde across the PIP joints holding them in a reduced fashion.  The wires were then advanced across the MP joint holding the toes straight.  AP and lateral fluoroscopic images confirmed appropriate position of the third and fourth toes.  Both of those pins were bent, trimmed and capped.  Attention was then returned to the second toe where a plantar incision was made.  The flexor tendon sheath was exposed under the proximal phalanx.  The tendon sheath was incised.  The flexor digitorum brevis tendon was then isolated and released percutaneously from the insertion on the distal phalanx.  It was split and tagged.  The tendon ends were then passed up along the shaft of the second toe proximal phalanx into the dorsal incision.  The toe was pulled down into a reduced position. A 0.054 K-wire was inserted from the tip of the toe across the PIP and DIP joints and then across the MP joint holding the toe in a reduced position.  AP and lateral fluoroscopic images confirmed appropriate reduction of the PIP joint as well as appropriate position of the toe. The pin was bent, trimmed and capped.  The flexor tendon was then repaired over the dorsum of the proximal phalanx with horizontal mattress sutures of 2-0 Vicryl.  The tendon ends were trimmed.  The extensor tendon was repaired over the dorsum of the toe.  The extensor digitorum longus and brevis tendons were then repaired with simple sutures of 2-0 Vicryl at the second and third MTP joints.  The second toe incision was closed with horizontal mattress sutures of 3-0 nylon. The remaining incisions were all closed with horizontal mattress sutures of 3-0 nylon.  Sterile dressings were applied followed by a compression wrap.  The tourniquet was released at 77 minutes.  The patient was then awakened from anesthesia and transported to the  recovery room in stable condition.  FOLLOWUP PLAN:  The patient will be weightbearing as tolerated on her heel and a DARCO shoe.  She will follow up with me in 2 weeks for suture removal and dressing change.     [name], MD     JH/MEDQ  D:  10/27/2012  T:  10/27/2012  Job:  [id number]

## 2012-10-31 ENCOUNTER — Encounter (HOSPITAL_BASED_OUTPATIENT_CLINIC_OR_DEPARTMENT_OTHER): Payer: Self-pay | Admitting: Orthopedic Surgery

## 2012-11-10 ENCOUNTER — Ambulatory Visit: Payer: 59 | Admitting: Internal Medicine

## 2013-07-03 ENCOUNTER — Other Ambulatory Visit (HOSPITAL_COMMUNITY): Payer: Self-pay | Admitting: Family Medicine

## 2013-07-03 DIAGNOSIS — Z1231 Encounter for screening mammogram for malignant neoplasm of breast: Secondary | ICD-10-CM

## 2013-07-13 ENCOUNTER — Ambulatory Visit (HOSPITAL_COMMUNITY)
Admission: RE | Admit: 2013-07-13 | Discharge: 2013-07-13 | Disposition: A | Discharge status: Home / Self Care | Payer: Self-pay | Source: Ambulatory Visit | Attending: Family Medicine | Admitting: Family Medicine

## 2013-07-13 DIAGNOSIS — Z1231 Encounter for screening mammogram for malignant neoplasm of breast: Secondary | ICD-10-CM

## 2014-09-24 ENCOUNTER — Encounter (HOSPITAL_BASED_OUTPATIENT_CLINIC_OR_DEPARTMENT_OTHER): Payer: Self-pay | Admitting: Orthopedic Surgery

## 2015-03-15 ENCOUNTER — Other Ambulatory Visit (HOSPITAL_COMMUNITY): Payer: Self-pay | Admitting: Family Medicine

## 2015-03-15 DIAGNOSIS — Z1231 Encounter for screening mammogram for malignant neoplasm of breast: Secondary | ICD-10-CM

## 2015-03-27 ENCOUNTER — Ambulatory Visit (HOSPITAL_COMMUNITY): Payer: Self-pay | Attending: Family Medicine

## 2015-04-10 ENCOUNTER — Encounter (HOSPITAL_COMMUNITY): Payer: Self-pay

## 2015-04-10 ENCOUNTER — Emergency Department (INDEPENDENT_AMBULATORY_CARE_PROVIDER_SITE_OTHER)
Admission: EM | Admit: 2015-04-10 | Discharge: 2015-04-10 | Disposition: A | Discharge status: Home / Self Care | Payer: Self-pay | Source: Home / Self Care | Attending: Family Medicine | Admitting: Family Medicine

## 2015-04-10 DIAGNOSIS — L03211 Cellulitis of face: Secondary | ICD-10-CM

## 2015-04-10 DIAGNOSIS — R739 Hyperglycemia, unspecified: Secondary | ICD-10-CM

## 2015-04-10 LAB — GLUCOSE, CAPILLARY: Glucose-Capillary: 289 mg/dL — ABNORMAL HIGH (ref 65–99)

## 2015-04-10 MED ORDER — CEFTRIAXONE SODIUM 1 G IJ SOLR
1.0000 g | Freq: Once | INTRAMUSCULAR | Status: AC
  Administered 2015-04-10: 1 g via INTRAMUSCULAR

## 2015-04-10 MED ORDER — ACETAMINOPHEN 325 MG PO TABS
975.0000 mg | ORAL_TABLET | Freq: Once | ORAL | Status: AC
  Administered 2015-04-10: 975 mg via ORAL

## 2015-04-10 MED ORDER — GLIPIZIDE 5 MG PO TABS: 5.0000 mg | ORAL_TABLET | Freq: Every day | ORAL | Status: DC

## 2015-04-10 MED ORDER — ACETAMINOPHEN 325 MG PO TABS
ORAL_TABLET | ORAL | Status: AC
  Filled 2015-04-10: qty 3

## 2015-04-10 MED ORDER — CEPHALEXIN 500 MG PO CAPS: 500.0000 mg | ORAL_CAPSULE | Freq: Four times a day (QID) | ORAL | Status: DC

## 2015-04-10 MED ORDER — CEFTRIAXONE SODIUM 1 G IJ SOLR
INTRAMUSCULAR | Status: AC
  Filled 2015-04-10: qty 10

## 2015-04-10 MED ORDER — CEFTRIAXONE SODIUM 1 G IJ SOLR: 1.0000 g | Freq: Once | INTRAMUSCULAR | Status: DC

## 2015-04-10 MED ORDER — LIDOCAINE HCL (PF) 1 % IJ SOLN
INTRAMUSCULAR | Status: AC
  Filled 2015-04-10: qty 5

## 2015-04-10 NOTE — ED Notes (Signed)
States she has been having pain and swelling in her nose and upper lip x couple of days. Denies in jury

## 2015-04-10 NOTE — Discharge Instructions (Signed)
Both medications (one for facial infection and one for your diabetes) should be free if you fill them at [place]. Warm compresses to nose 3 x day. Ibuprofen or tylenol as directed on packaging for pain.   Cellulitis Cellulitis is an infection of the skin and the tissue beneath it. The infected area is usually red and tender. Cellulitis occurs most often in the arms and lower legs.  CAUSES  Cellulitis is caused by bacteria that enter the skin through cracks or cuts in the skin. The most common types of bacteria that cause cellulitis are staphylococci and streptococci. SIGNS AND SYMPTOMS   Redness and warmth.  Swelling.  Tenderness or pain.  Fever. DIAGNOSIS  Your health care provider can usually determine what is wrong based on a physical exam. Blood tests may also be done. TREATMENT  Treatment usually involves taking an antibiotic medicine. HOME CARE INSTRUCTIONS   Take your antibiotic medicine as directed by your health care provider. Finish the antibiotic even if you start to feel better.  Keep the infected arm or leg elevated to reduce swelling.  Apply a warm cloth to the affected area up to 4 times per day to relieve pain.  Take medicines only as directed by your health care provider.  Keep all follow-up visits as directed by your health care provider. SEEK MEDICAL CARE IF:   You notice red streaks coming from the infected area.  Your red area gets larger or turns dark in color.  Your bone or joint underneath the infected area becomes painful after the skin has healed.  Your infection returns in the same area or another area.  You notice a swollen bump in the infected area.  You develop new symptoms.  You have a fever. SEEK IMMEDIATE MEDICAL CARE IF:   You feel very sleepy.  You develop vomiting or diarrhea.  You have a general ill feeling (malaise) with muscle aches and pains. MAKE SURE YOU:   Understand these instructions.  Will watch  your condition.  Will get help right away if you are not doing well or get worse. Document Released: 08/19/2005 Document Revised: 03/26/2014 Document Reviewed: 01/25/2012 [facility] Patient Information 2015 [place], [place]. This information is not intended to replace advice given to you by your health care provider. Make sure you discuss any questions you have with your health care provider.  Facial Infection You have an infection of your face. This requires special attention to help prevent serious problems. Infections in facial wounds can cause poor healing and scars. They can also spread to deeper tissues, especially around the eye. Wound and dental infections can lead to sinusitis, infection of the eye socket, and even meningitis. Permanent damage to the skin, eye, and nervous system may result if facial infections are not treated properly. With severe infections, hospital care for IV antibiotic injections may be needed if they don't respond to oral antibiotics. Antibiotics must be taken for the full course to insure the infection is eliminated. If the infection came from a bad tooth, it may have to be extracted when the infection is under control. Warm compresses may be applied to reduce skin irritation and remove drainage. You might need a tetanus shot now if:  You cannot remember when your last tetanus shot was.  You have never had a tetanus shot.  The object that caused your wound was dirty. If you need a tetanus shot, and you decide not to get one, there is a rare chance of getting tetanus.  Sickness from tetanus can be serious. If you got a tetanus shot, your arm may swell, get red and warm to the touch at the shot site. This is common and not a problem. SEEK IMMEDIATE MEDICAL CARE IF:   You have increased swelling, redness, or trouble breathing.  You have a severe headache, dizziness, nausea, or vomiting.  You develop problems with your eyesight.  You have a fever. Document  Released: 12/17/2004 Document Revised: 02/01/2012 Document Reviewed: 11/09/2005 [facility] Patient Information 2015 [place], [place]. This information is not intended to replace advice given to you by your health care provider. Make sure you discuss any questions you have with your health care provider.  Hyperglycemia Hyperglycemia occurs when the glucose (sugar) in your blood is too high. Hyperglycemia can happen for many reasons, but it most often happens to people who do not know they have diabetes or are not managing their diabetes properly.  CAUSES  Whether you have diabetes or not, there are other causes of hyperglycemia. Hyperglycemia can occur when you have diabetes, but it can also occur in other situations that you might not be as aware of, such as: Diabetes  If you have diabetes and are having problems controlling your blood glucose, hyperglycemia could occur because of some of the following reasons:  Not following your meal plan.  Not taking your diabetes medications or not taking it properly.  Exercising less or doing less activity than you normally do.  Being sick. Pre-diabetes  This cannot be ignored. Before people develop Type 2 diabetes, they almost always have "pre-diabetes." This is when your blood glucose levels are higher than normal, but not yet high enough to be diagnosed as diabetes. Research has shown that some long-term damage to the body, especially the heart and circulatory system, may already be occurring during pre-diabetes. If you take action to manage your blood glucose when you have pre-diabetes, you may delay or prevent Type 2 diabetes from developing. Stress  If you have diabetes, you may be "diet" controlled or on oral medications or insulin to control your diabetes. However, you may find that your blood glucose is higher than usual in the hospital whether you have diabetes or not. This is often referred to as "stress hyperglycemia." Stress can elevate your  blood glucose. This happens because of hormones put out by the body during times of stress. If stress has been the cause of your high blood glucose, it can be followed regularly by your caregiver. That way he/she can make sure your hyperglycemia does not continue to get worse or progress to diabetes. Steroids  Steroids are medications that act on the infection fighting system (immune system) to block inflammation or infection. One side effect can be a rise in blood glucose. Most people can produce enough extra insulin to allow for this rise, but for those who cannot, steroids make blood glucose levels go even higher. It is not unusual for steroid treatments to "uncover" diabetes that is developing. It is not always possible to determine if the hyperglycemia will go away after the steroids are stopped. A special blood test called an A1c is sometimes done to determine if your blood glucose was elevated before the steroids were started. SYMPTOMS  Thirsty.  Frequent urination.  Dry mouth.  Blurred vision.  Tired or fatigue.  Weakness.  Sleepy.  Tingling in feet or leg. DIAGNOSIS  Diagnosis is made by monitoring blood glucose in one or all of the following ways:  A1c test. This is a  chemical found in your blood.  Fingerstick blood glucose monitoring.  Laboratory results. TREATMENT  First, knowing the cause of the hyperglycemia is important before the hyperglycemia can be treated. Treatment may include, but is not be limited to:  Education.  Change or adjustment in medications.  Change or adjustment in meal plan.  Treatment for an illness, infection, etc.  More frequent blood glucose monitoring.  Change in exercise plan.  Decreasing or stopping steroids.  Lifestyle changes. HOME CARE INSTRUCTIONS   Test your blood glucose as directed.  Exercise regularly. Your caregiver will give you instructions about exercise. Pre-diabetes or diabetes which comes on with stress is  helped by exercising.  Eat wholesome, balanced meals. Eat often and at regular, fixed times. Your caregiver or nutritionist will give you a meal plan to guide your sugar intake.  Being at an ideal weight is important. If needed, losing as little as 10 to 15 pounds may help improve blood glucose levels. SEEK MEDICAL CARE IF:   You have questions about medicine, activity, or diet.  You continue to have symptoms (problems such as increased thirst, urination, or weight gain). SEEK IMMEDIATE MEDICAL CARE IF:   You are vomiting or have diarrhea.  Your breath smells fruity.  You are breathing faster or slower.  You are very sleepy or incoherent.  You have numbness, tingling, or pain in your feet or hands.  You have chest pain.  Your symptoms get worse even though you have been following your caregiver's orders.  If you have any other questions or concerns. Document Released: 05/05/2001 Document Revised: 02/01/2012 Document Reviewed: 03/07/2012 [facility] Patient Information 2015 [place], [place]. This information is not intended to replace advice given to you by your health care provider. Make sure you discuss any questions you have with your health care provider.

## 2015-04-10 NOTE — ED Provider Notes (Signed)
CSN: [id number]     Arrival date & time 04/10/15  1345 History   First MD Initiated Contact with Patient 04/10/15 1534     Chief Complaint  Patient presents with  . Facial Pain   (Consider location/radiation/quality/duration/timing/severity/associated sxs/prior Treatment) HPI Comments: Patient developed area of cracked, dry sore skin in the inside of her left nostril 3 days ago. Nose and upper cheeks have become progressively swollen, erythematous and painful over past 3 days. Self discontinued Metformin 2 years ago as this medication caused her diarrhea and her T2DM has not been treated or managed since that time. Unemployed. Homeless. Lost job in 2013. PCP: none  The history is provided by the patient.    Past Medical History  Diagnosis Date  . Anxiety   . Diabetes mellitus   . GERD (gastroesophageal reflux disease)   . Hyperlipidemia   . Menopause   . ADD (attention deficit disorder)   . ADHD (attention deficit hyperactivity disorder)    Past Surgical History  Procedure Laterality Date  . Mouth surgery  2010  . Gallbladder surgery  1993  . Varicose veins  2011and 1991  . Knee arthroscopy  2008 and 2009    both knees  . Wrist surgery      cyst removal  . Foot surgery  2012  . Hammertoe reconstruction with weil osteotomy  10/27/2012    Procedure: HAMMERTOE RECONSTRUCTION WITH WEIL OSTEOTOMY;  Surgeon: [name], MD;  Location: [facility];  Service: Orthopedics;  Laterality: Right;  Right Second, Third and Fouth  Hammertoe Correction; Right Second and Third Metatarsal Weil Osteotomy; Second and Third Dorsal Capsulotomy;  Second Flexor Extensor Transfer   Family History  Problem Relation Age of Onset  . Heart attack Maternal Grandfather   . Heart attack Father   . Cancer Mother     skin  . Cancer Maternal Uncle     skin  . Emphysema Maternal Aunt   . Schizophrenia Mother   . Depression Mother   . ADD / ADHD Son   . Diabetes Mother   . Hypertension  Mother   . Hypertension Maternal Grandfather    History  Substance Use Topics  . Smoking status: Former Smoker -- 10.50 packs/day for 30 years    Types: Cigarettes    Quit date: 06/02/2007  . Smokeless tobacco: Never Used  . Alcohol Use: No   OB History    Gravida Para Term Preterm AB TAB SAB Ectopic Multiple Living   3 3 3       2      Review of Systems  All other systems reviewed and are negative.   Allergies  Review of patient's allergies indicates no known allergies.  Home Medications   Prior to Admission medications   Medication Sig Start Date End Date Taking? Authorizing Provider  ALPRAZolam Duanne Moron) 0.25 MG tablet  03/10/12  Yes Historical Provider, MD  zolpidem (AMBIEN) 10 MG tablet  04/14/12  Yes Historical Provider, MD  amphetamine-dextroamphetamine (ADDERALL) 10 MG tablet  06/07/12   Historical Provider, MD  buPROPion [facility] SR) 150 MG 12 hr tablet  05/09/12   Historical Provider, MD  cephALEXin (KEFLEX) 500 MG capsule Take 1 capsule (500 mg total) by mouth 4 (four) times daily. X 10 days 04/10/15   Audelia Hives Velva Molinari, PA  colesevelam [facility]) 625 MG tablet Take 1,875 mg by mouth 2 (two) times daily with a meal.    Historical Provider, MD  glipiZIDE (GLUCOTROL) 5 MG tablet Take  1 tablet (5 mg total) by mouth daily before breakfast. 04/10/15   Lutricia Feil, PA  Multiple Vitamin (MULTIVITAMIN) tablet Take 1 tablet by mouth daily.    Historical Provider, MD  oxyCODONE (ROXICODONE) 5 MG immediate release tablet Take 1-2 tablets (5-10 mg total) by mouth every 4 (four) hours as needed for pain. 10/27/12   [name], MD  pantoprazole (PROTONIX) 40 MG tablet  05/24/12   Historical Provider, MD  VITAMIN D, CHOLECALCIFEROL, PO Take by mouth.    Historical Provider, MD   BP 140/74 mmHg  Pulse 81  Temp(Src) 98.2 F (36.8 C) (Oral)  Resp 16  SpO2 100% Physical Exam  Constitutional: She is oriented to person, place, and time. She appears well-developed and  well-nourished. No distress.  HENT:  Head: Normocephalic and atraumatic.  Right Ear: Hearing and external ear normal.  Left Ear: Hearing and external ear normal.  Nose:    Cracked erythematous, edematous skin inside both nostrils weeping yellow crusted material Entire nose is swollen, erythematous and tender  Cardiovascular: Normal rate, regular rhythm and normal heart sounds.   Pulmonary/Chest: Effort normal and breath sounds normal.  Musculoskeletal: Normal range of motion.  Neurological: She is alert and oriented to person, place, and time.  Skin: Skin is warm and dry. There is erythema.  Psychiatric: She has a normal mood and affect. Her behavior is normal.  Nursing note and vitals reviewed.   ED Course  Procedures (including critical care time) Labs Review Labs Reviewed  GLUCOSE, CAPILLARY - Abnormal; Notable for the following:    Glucose-Capillary 289 (*)    All other components within normal limits    Imaging Review No results found.   MDM   1. Hyperglycemia   2. Facial cellulitis   CBG 289. Will provide patient with Rx for glipizide 5mg  po QD as this medication is free at [facility].  Attempted to being patient's treatment for nasal cellulitis with Rocephin 1gm IM at [facility], however, patient refused medication. Will treat with Cephalexin 500mg  po QID x 10 days at home as this medication is also available for free at [facility].  Advised to follow up on 04/12/2015 if no improvement.  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

## 2015-05-01 ENCOUNTER — Ambulatory Visit: Payer: Self-pay | Attending: Internal Medicine

## 2015-05-10 ENCOUNTER — Ambulatory Visit: Payer: Self-pay | Attending: Internal Medicine | Admitting: Internal Medicine

## 2015-05-10 ENCOUNTER — Ambulatory Visit: Payer: Self-pay

## 2015-05-10 ENCOUNTER — Encounter: Payer: Self-pay | Admitting: Internal Medicine

## 2015-05-10 VITALS — BP 139/85 | HR 71 | Wt 165.8 lb

## 2015-05-10 DIAGNOSIS — E1142 Type 2 diabetes mellitus with diabetic polyneuropathy: Secondary | ICD-10-CM

## 2015-05-10 DIAGNOSIS — H6123 Impacted cerumen, bilateral: Secondary | ICD-10-CM

## 2015-05-10 DIAGNOSIS — Z23 Encounter for immunization: Secondary | ICD-10-CM

## 2015-05-10 DIAGNOSIS — K219 Gastro-esophageal reflux disease without esophagitis: Secondary | ICD-10-CM

## 2015-05-10 DIAGNOSIS — Z1239 Encounter for other screening for malignant neoplasm of breast: Secondary | ICD-10-CM

## 2015-05-10 LAB — POCT GLYCOSYLATED HEMOGLOBIN (HGB A1C): Hemoglobin A1C: 9

## 2015-05-10 LAB — GLUCOSE, POCT (MANUAL RESULT ENTRY): POC Glucose: 137 mg/dl — AB (ref 70–99)

## 2015-05-10 MED ORDER — LISINOPRIL 2.5 MG PO TABS: 2.5000 mg | ORAL_TABLET | Freq: Every day | ORAL | Status: DC

## 2015-05-10 MED ORDER — TRUE METRIX METER W/DEVICE KIT: PACK | Status: DC

## 2015-05-10 MED ORDER — GLUCOSE BLOOD VI STRP: 1.0000 | ORAL_STRIP | Freq: Three times a day (TID) | Status: AC

## 2015-05-10 MED ORDER — GLUCOSE BLOOD VI STRP: ORAL_STRIP | Status: AC

## 2015-05-10 MED ORDER — GLUCOSE BLOOD VI STRP: ORAL_STRIP | Status: DC

## 2015-05-10 MED ORDER — OMEPRAZOLE 20 MG PO CPDR: 20.0000 mg | DELAYED_RELEASE_CAPSULE | Freq: Every day | ORAL | Status: DC

## 2015-05-10 MED ORDER — GLIPIZIDE 5 MG PO TABS: 5.0000 mg | ORAL_TABLET | Freq: Two times a day (BID) | ORAL | Status: DC

## 2015-05-10 MED ORDER — TRUEPLUS LANCETS 28G MISC: Status: DC

## 2015-05-10 NOTE — Progress Notes (Signed)
  New patient here to established care. She would like to discuss her Diabetes and Hyperlipidemia.

## 2015-05-10 NOTE — Patient Instructions (Signed)
Diabetes and Foot Care Diabetes may cause you to have problems because of poor blood supply (circulation) to your feet and legs. This may cause the skin on your feet to become thinner, break easier, and heal more slowly. Your skin may become dry, and the skin may peel and crack. You may also have nerve damage in your legs and feet causing decreased feeling in them. You may not notice minor injuries to your feet that could lead to infections or more serious problems. Taking care of your feet is one of the most important things you can do for yourself.  HOME CARE INSTRUCTIONS  Wear shoes at all times, even in the house. Do not go barefoot. Bare feet are easily injured.  Check your feet daily for blisters, cuts, and redness. If you cannot see the bottom of your feet, use a mirror or ask someone for help.  Wash your feet with warm water (do not use hot water) and mild soap. Then pat your feet and the areas between your toes until they are completely dry. Do not soak your feet as this can dry your skin.  Apply a moisturizing lotion or petroleum jelly (that does not contain alcohol and is unscented) to the skin on your feet and to dry, brittle toenails. Do not apply lotion between your toes.  Trim your toenails straight across. Do not dig under them or around the cuticle. File the edges of your nails with an emery board or nail file.  Do not cut corns or calluses or try to remove them with medicine.  Wear clean socks or stockings every day. Make sure they are not too tight. Do not wear knee-high stockings since they may decrease blood flow to your legs.  Wear shoes that fit properly and have enough cushioning. To break in new shoes, wear them for just a few hours a day. This prevents you from injuring your feet. Always look in your shoes before you put them on to be sure there are no objects inside.  Do not cross your legs. This may decrease the blood flow to your feet.  If you find a minor scrape,  cut, or break in the skin on your feet, keep it and the skin around it clean and dry. These areas may be cleansed with mild soap and water. Do not cleanse the area with peroxide, alcohol, or iodine.  When you remove an adhesive bandage, be sure not to damage the skin around it.  If you have a wound, look at it several times a day to make sure it is healing.  Do not use heating pads or hot water bottles. They may burn your skin. If you have lost feeling in your feet or legs, you may not know it is happening until it is too late.  Make sure your health care provider performs a complete foot exam at least annually or more often if you have foot problems. Report any cuts, sores, or bruises to your health care provider immediately. SEEK MEDICAL CARE IF:   You have an injury that is not healing.  You have cuts or breaks in the skin.  You have an ingrown nail.  You notice redness on your legs or feet.  You feel burning or tingling in your legs or feet.  You have pain or cramps in your legs and feet.  Your legs or feet are numb.  Your feet always feel cold. SEEK IMMEDIATE MEDICAL CARE IF:   There is increasing redness,   swelling, or pain in or around a wound.  There is a red line that goes up your leg.  Pus is coming from a wound.  You develop a fever or as directed by your health care provider.  You notice a bad smell coming from an ulcer or wound. Document Released: 11/06/2000 Document Revised: 07/12/2013 Document Reviewed: 04/18/2013 Exit[employer] Patient Information 2015 [employer]. This information is not intended to replace advice given to you by your health care provider. Make sure you discuss any questions you have with your health care provider.  

## 2015-05-10 NOTE — Progress Notes (Signed)
Patient ID: Wendy Underwood, female   DOB: 26-Feb-1951, 64 y.o.   MRN: [id number]   OLM:[id number]  EFE:[id number]  DOB - May 04, 1951  CC:  Chief Complaint  Patient presents with  . New patient    Diabetes   . Hyperlipidemia       HPI: Wendy Underwood is a 64 y.o. female here today to establish medical care. She has a past medical history of HLD and T2DM.  Has been on several statins that gave her severe leg pain. She was on 3 different statins. She was previously on Metformin 2 years ago and reports that it gave her diarrhea so she switched to glipizide 42m once daily. She was previously being see by Wendy [facility]wood for ADHD but [facility]. She has lost a total of 30 pounds intentionally in one year.    Last pap smear--5 years ago Mammogram---2 years ago Colonoscopy---never Opthalmology---need eye exam  Patient has No headache, No chest pain, No abdominal pain - No Nausea, No new weakness tingling or numbness, No Cough - SOB.  No Known Allergies Past Medical History  Diagnosis Date  . Anxiety   . Diabetes mellitus   . GERD (gastroesophageal reflux disease)   . Hyperlipidemia   . Menopause   . ADD (attention deficit disorder)   . ADHD (attention deficit hyperactivity disorder)    Current Outpatient Prescriptions on File Prior to Visit  Medication Sig Dispense Refill  . ALPRAZolam (XANAX) 0.25 MG tablet     . amphetamine-dextroamphetamine (ADDERALL) 10 MG tablet     . glipiZIDE (GLUCOTROL) 5 MG tablet Take 1 tablet (5 mg total) by mouth daily before breakfast. 30 tablet 0  . Multiple Vitamin (MULTIVITAMIN) tablet Take 1 tablet by mouth daily.    . pantoprazole (PROTONIX) 40 MG tablet     . VITAMIN D, CHOLECALCIFEROL, PO Take by mouth.    . zolpidem (AMBIEN) 10 MG tablet     . buPROPion (WELLBUTRIN SR) 150 MG 12 hr tablet     . cephALEXin (KEFLEX) 500 MG capsule Take 1 capsule (500 mg total) by mouth 4 (four)  times daily. X 10 days (Patient not taking: Reported on 05/10/2015) 40 capsule 0  . colesevelam (WELCHOL) 625 MG tablet Take 1,875 mg by mouth 2 (two) times daily with a meal.    . oxyCODONE (ROXICODONE) 5 MG immediate release tablet Take 1-2 tablets (5-10 mg total) by mouth every 4 (four) hours as needed for pain. (Patient not taking: Reported on 05/10/2015) 50 tablet 0  . [DISCONTINUED] metFORMIN (GLUMETZA) 500 MG (MOD) 24 hr tablet Take 1 tablet (500 mg total) by mouth daily with breakfast. 90 tablet 3   No current facility-administered medications on file prior to visit.   Family History  Problem Relation Age of Onset  . Heart attack Maternal Grandfather   . Heart attack Father   . Cancer Mother     skin  . Cancer Maternal Uncle     skin  . Emphysema Maternal Aunt   . Schizophrenia Mother   . Depression Mother   . ADD / ADHD Son   . Diabetes Mother   . Hypertension Mother   . Hypertension Maternal Grandfather    History   Social History  . Marital Status: Divorced    Spouse Name: N/A  . Number of Children: N/A  . Years of Education: N/A   Occupational History  . Not on file.   Social  History Main Topics  . Smoking status: Former Smoker -- 10.50 packs/day for 30 years    Types: Cigarettes    Quit date: 06/02/2007  . Smokeless tobacco: Never Used  . Alcohol Use: No  . Drug Use: No  . Sexual Activity: No   Other Topics Concern  . Not on file   Social History Narrative    Review of Systems: Constitutional: Negative for fever, chills, diaphoresis, activity change, appetite change and fatigue. HENT: Negative for ear pain, nosebleeds, congestion, facial swelling, rhinorrhea, neck pain, neck stiffness and ear discharge.  Eyes: Negative for pain, discharge, redness, itching and visual disturbance. Respiratory: Negative for cough, choking, chest tightness, shortness of breath, wheezing and stridor.  Cardiovascular: Negative for chest pain, palpitations and leg  swelling. Gastrointestinal: Negative for abdominal distention. Genitourinary: Negative for dysuria, urgency, frequency, hematuria, flank pain, decreased urine volume, difficulty urinating and dyspareunia.  Musculoskeletal: Negative for back pain, joint swelling, arthralgia and gait problem. Neurological: Negative for dizziness, tremors, seizures, syncope, facial asymmetry, speech difficulty, weakness, light-headedness, numbness and headaches.  Hematological: Negative for adenopathy. Does not bruise/bleed easily. Psychiatric/Behavioral: Negative for hallucinations, behavioral problems, confusion, dysphoric mood, decreased concentration and agitation.    Objective:   Filed Vitals:   05/10/15 1009  BP: 139/85  Pulse: 71    Physical Exam  Constitutional: She is oriented to person, place, and time.  Cardiovascular: Normal rate, regular rhythm and normal heart sounds.   Pulmonary/Chest: Effort normal and breath sounds normal.  Neurological: She is alert and oriented to person, place, and time.  Skin: Skin is warm and dry.     Lab Results  Component Value Date   WBC 6.2 06/01/2012   HGB 13.6 10/27/2012   HCT 40.0 10/27/2012   MCV 84.0 06/01/2012   PLT 307 06/01/2012   Lab Results  Component Value Date   CREATININE 0.80 10/27/2012   BUN 8 10/27/2012   NA 144 10/27/2012   K 4.2 10/27/2012   CL 110 10/27/2012   CO2 25 06/01/2012    Lab Results  Component Value Date   HGBA1C 9.0 05/10/2015   Lipid Panel     Component Value Date/Time   CHOL 238* 06/01/2012 1603   TRIG 147 06/01/2012 1603   HDL 37* 06/01/2012 1603   CHOLHDL 6.4 06/01/2012 1603   VLDL 29 06/01/2012 1603   LDLCALC 172* 06/01/2012 1603       Assessment and plan:   Wendy Underwood was seen today for new patient and hyperlipidemia.  Diagnoses and all orders for this visit:  Type 2 diabetes mellitus with diabetic polyneuropathy Orders: -     POCT glycosylated hemoglobin (Hb A1C) -     POCT glucose (manual  entry) -     Microalbumin, urine -     Lipid panel; Future -     CBC; Future -     COMPLETE METABOLIC PANEL WITH GFR; Future -     Vitamin D, 25-hydroxy; Future -     Refill glipiZIDE (GLUCOTROL) 5 MG tablet; Take 1 tablet (5 mg total) by mouth 2 (two) times daily before a meal. -     Begin lisinopril (ZESTRIL) 2.5 MG tablet; Take 1 tablet (2.5 mg total) by mouth daily.---for kidney protection -     glucose blood (ACCU-CHEK AVIVA) test strip; Use as instructed -     Ambulatory referral to Podiatry -     Blood Glucose Monitoring Suppl (TRUE METRIX METER) W/DEVICE KIT; Use as directed! -  glucose blood (TRUE METRIX BLOOD GLUCOSE TEST) test strip; 1 each by Other route 3 (three) times daily. Use as instructed I have increased patients glipizide to twice daily to see if she can get better control of symptoms.  Will put patient on Zetia if cholesterol is elevated.   Gastroesophageal reflux disease, esophagitis presence not specified Orders: -   Switched to omeprazole (PRILOSEC) 20 MG capsule; Take 1 capsule (20 mg total) by mouth daily before breakfast. Discussed diet and weight with patient relating to acid reflux.  Went over things that may exacerbate acid reflux such as tomatoes, spicy foods, coffee, carbonated beverages, chocolates, etc.  Advised patient to avoid laying down at least two hours after meals and sleep with HOB elevated.   Cerumen impaction, bilateral Orders: -     Ear Lavage  Breast cancer screening Orders: -     MM Digital Screening; Future  Need for Tdap vaccination Orders: -     Tdap vaccine greater than or equal to 7yo IM Will place colonoscopy order once has orange card  Return in about 3 months (around 08/10/2015) for Diabetes Mellitus.   Chari Manning, [facility] [phone] 05/10/2015, 11:00 AM

## 2015-05-11 LAB — MICROALBUMIN, URINE: Microalb, Ur: 0.6 mg/dL (ref <2.0)

## 2015-05-22 ENCOUNTER — Telehealth: Payer: Self-pay | Admitting: Internal Medicine

## 2015-05-22 NOTE — Telephone Encounter (Signed)
Patient has called in today to request a vaccine for shingles; please f/u with patient about this request

## 2015-05-24 ENCOUNTER — Other Ambulatory Visit: Payer: Self-pay

## 2015-05-26 ENCOUNTER — Encounter (HOSPITAL_COMMUNITY): Payer: Self-pay | Admitting: Emergency Medicine

## 2015-05-26 ENCOUNTER — Emergency Department (HOSPITAL_COMMUNITY)
Admission: EM | Admit: 2015-05-26 | Discharge: 2015-05-27 | Discharge status: Against Medical Advice | Payer: Self-pay | Attending: Emergency Medicine | Admitting: Emergency Medicine

## 2015-05-26 DIAGNOSIS — K219 Gastro-esophageal reflux disease without esophagitis: Secondary | ICD-10-CM | POA: Insufficient documentation

## 2015-05-26 DIAGNOSIS — Z79899 Other long term (current) drug therapy: Secondary | ICD-10-CM | POA: Insufficient documentation

## 2015-05-26 DIAGNOSIS — Z8659 Personal history of other mental and behavioral disorders: Secondary | ICD-10-CM | POA: Insufficient documentation

## 2015-05-26 DIAGNOSIS — Z87891 Personal history of nicotine dependence: Secondary | ICD-10-CM | POA: Insufficient documentation

## 2015-05-26 DIAGNOSIS — Z780 Asymptomatic menopausal state: Secondary | ICD-10-CM | POA: Insufficient documentation

## 2015-05-26 DIAGNOSIS — E119 Type 2 diabetes mellitus without complications: Secondary | ICD-10-CM | POA: Insufficient documentation

## 2015-05-26 DIAGNOSIS — R600 Localized edema: Secondary | ICD-10-CM | POA: Insufficient documentation

## 2015-05-26 NOTE — ED Notes (Signed)
Pt c/o bilat LE edema x 3 days, denies CP, denies SHOB.

## 2015-05-27 LAB — URINALYSIS, ROUTINE W REFLEX MICROSCOPIC
Glucose, UA: >1000 mg/dL — AB
HGB URINE DIPSTICK: NEGATIVE
KETONES UR: NEGATIVE mg/dL
LEUKOCYTES UA: NEGATIVE
NITRITE: NEGATIVE
PROTEIN: NEGATIVE mg/dL
SPECIFIC GRAVITY, URINE: 1.033 — AB (ref 1.005–1.030)
UROBILINOGEN UA: 1 mg/dL (ref 0.0–1.0)
pH: 6 (ref 5.0–8.0)

## 2015-05-27 LAB — URINE MICROSCOPIC-ADD ON

## 2015-05-27 NOTE — ED Notes (Signed)
This writer attempted to draw labs from Patient. Patient stated that she "did not want to be stuck again." Patient stated that she was getting dressed to leave. She did not want to give her blood.

## 2015-05-27 NOTE — ED Notes (Signed)
Phlebotomy left room and told Nursing that the pt had stated that she "didn't wish to be stuck again."  Nursing attempted to talk to the patient but patient was agitated that she was misunderstood about being stuck.  Nursing stated that they could stick her but patient continued to walk out.  Nurse asked patient if she wished to be seen and she stated yes but continued to walk out.  Pt was again asked by Nursing if she wished to be seen.  Pt did not respond.  Nursing asked if she could sign AMA form patient stated she would not sign form and that she was not leaving AMA.  Pt was advised that if she left whether she signed form or not she would be AMA.

## 2015-05-27 NOTE — ED Provider Notes (Signed)
CSN: [id number]     Arrival date & time 05/26/15  2321 History   First MD Initiated Contact with Patient 05/26/15 2341     Chief Complaint  Patient presents with  . Leg Swelling     (Consider location/radiation/quality/duration/timing/severity/associated sxs/prior Treatment) HPI Comments: Patient with history of diabetes on oral anti-hyperglycemics, hypertension on lisinopril, varicose veins in legs-- presents with bilateral lower extremity swelling for the past 2 weeks. Patient states the symptoms started when she began living in her car. No history of heart failure, liver problems, kidney problems. She does not do a lot of walking. No chest pain, SOB, N/V/D. No lower extremity redness or warmth. The onset of this condition was acute. The course is constant. Aggravating factors: none. Alleviating factors: none. 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    The history is provided by the patient and medical records.    Past Medical History  Diagnosis Date  . Anxiety   . Diabetes mellitus   . GERD (gastroesophageal reflux disease)   . Hyperlipidemia   . Menopause   . ADD (attention deficit disorder)   . ADHD (attention deficit hyperactivity disorder)    Past Surgical History  Procedure Laterality Date  . Mouth surgery  2010  . Gallbladder surgery  1993  . Varicose veins  2011and 1991  . Knee arthroscopy  2008 and 2009    both knees  . Wrist surgery      cyst removal  . Foot surgery  2012  . Hammertoe reconstruction with weil osteotomy  10/27/2012    Procedure: HAMMERTOE RECONSTRUCTION WITH WEIL OSTEOTOMY;  Surgeon: [name], MD;  Location: [facility];  Service: Orthopedics;  Laterality: Right;  Right Second, Third and Fouth  Hammertoe Correction; Right Second and Third Metatarsal Weil Osteotomy; Second and Third Dorsal Capsulotomy;  Second Flexor Extensor Transfer   Family History  Problem Relation Age of Onset  . Heart attack  Maternal Grandfather   . Heart attack Father   . Cancer Mother     skin  . Cancer Maternal Uncle     skin  . Emphysema Maternal Aunt   . Schizophrenia Mother   . Depression Mother   . ADD / ADHD Son   . Diabetes Mother   . Hypertension Mother   . Hypertension Maternal Grandfather    History  Substance Use Topics  . Smoking status: Former Smoker -- 10.50 packs/day for 30 years    Types: Cigarettes    Quit date: 06/02/2007  . Smokeless tobacco: Never Used  . Alcohol Use: No   OB History    Gravida Para Term Preterm AB TAB SAB Ectopic Multiple Living   3 3 3       2      Review of Systems  Constitutional: Negative for fever.  HENT: Negative for rhinorrhea and sore throat.   Eyes: Negative for redness.  Respiratory: Negative for cough.   Cardiovascular: Positive for leg swelling. Negative for chest pain.  Gastrointestinal: Negative for nausea, vomiting, abdominal pain and diarrhea.  Genitourinary: Negative for dysuria.  Musculoskeletal: Negative for myalgias.  Skin: Negative for rash.  Neurological: Negative for headaches.      Allergies  Atorvastatin  Home Medications   Prior to Admission medications   Medication Sig Start Date End Date Taking? Authorizing Provider  glipiZIDE (GLUCOTROL) 5 MG tablet Take 1 tablet (5 mg total) by mouth 2 (two) times daily before a meal. 05/10/15  Yes Jannifer Rodney  Feliciana Rossetti, NP  lisinopril (ZESTRIL) 2.5 MG tablet Take 1 tablet (2.5 mg total) by mouth daily. 05/10/15  Yes Lance Bosch, NP  Multiple Vitamin (MULTIVITAMIN) tablet Take 1 tablet by mouth daily.   Yes Historical Provider, MD  naproxen sodium (ANAPROX) 220 MG tablet Take 440 mg by mouth 2 (two) times daily as needed (pain).   Yes Historical Provider, MD  omeprazole (PRILOSEC) 20 MG capsule Take 1 capsule (20 mg total) by mouth daily before breakfast. 05/10/15  Yes Lance Bosch, NP  VITAMIN D, CHOLECALCIFEROL, PO Take 1 capsule by mouth daily.    Yes Historical Provider, MD   zolpidem (AMBIEN) 10 MG tablet Take 10 mg by mouth at bedtime.  04/14/12  Yes Historical Provider, MD  Blood Glucose Monitoring Suppl (TRUE METRIX METER) W/DEVICE KIT Use as directed! 05/10/15   Lance Bosch, NP  cephALEXin (KEFLEX) 500 MG capsule Take 1 capsule (500 mg total) by mouth 4 (four) times daily. X 10 days Patient not taking: Reported on 05/10/2015 04/10/15   Audelia Hives Presson, PA  glucose blood (ACCU-CHEK AVIVA) test strip Use as instructed 05/10/15   Lance Bosch, NP  glucose blood (TRUE METRIX BLOOD GLUCOSE TEST) test strip 1 each by Other route 3 (three) times daily. Use as instructed 05/10/15   Lance Bosch, NP  oxyCODONE (ROXICODONE) 5 MG immediate release tablet Take 1-2 tablets (5-10 mg total) by mouth every 4 (four) hours as needed for pain. Patient not taking: Reported on 05/10/2015 10/27/12   [name], MD  TRUEPLUS LANCETS 28G MISC Use TID 05/10/15   Lance Bosch, NP   BP 145/101 mmHg  Pulse 67  Temp(Src) 98 F (36.7 C) (Oral)  Resp 16  Ht 5' 6"  (1.676 m)  Wt 165 lb (74.844 kg)  BMI 26.64 kg/m2  SpO2 99%   Physical Exam  Constitutional: She appears well-developed and well-nourished.  HENT:  Head: Normocephalic and atraumatic.  Mouth/Throat: Mucous membranes are normal. Mucous membranes are not dry.  Eyes: Conjunctivae are normal. Right eye exhibits no discharge. Left eye exhibits no discharge.  Neck: Trachea normal and normal range of motion. Neck supple. Normal carotid pulses and no JVD present. No muscular tenderness present. Carotid bruit is not present. No tracheal deviation present.  Cardiovascular: Normal rate, regular rhythm, S1 normal, S2 normal, normal heart sounds and intact distal pulses.  Exam reveals no decreased pulses.   No murmur heard. Pulmonary/Chest: Effort normal and breath sounds normal. No respiratory distress. She has no wheezes. She exhibits no tenderness.  Abdominal: Soft. Normal aorta and bowel sounds are normal. There is no  tenderness. There is no rebound and no guarding.  Musculoskeletal: Normal range of motion. She exhibits edema and tenderness.  1+ pitting edema to mid-ankles bilaterally.   Neurological: She is alert.  Skin: Skin is warm and dry. She is not diaphoretic. No cyanosis. No pallor.  Psychiatric: She has a normal mood and affect.  Nursing note and vitals reviewed.   ED Course  Procedures (including critical care time) Labs Review Labs Reviewed  URINALYSIS, ROUTINE W REFLEX MICROSCOPIC (NOT AT [facility]) - Abnormal; Notable for the following:    Color, Urine AMBER (*)    APPearance CLOUDY (*)    Specific Gravity, Urine 1.033 (*)    Glucose, UA >1000 (*)    Bilirubin Urine SMALL (*)    All other components within normal limits  URINE MICROSCOPIC-ADD ON - Abnormal; Notable for the following:  Crystals CA OXALATE CRYSTALS (*)    All other components within normal limits  CBC WITH DIFFERENTIAL/PLATELET  COMPREHENSIVE METABOLIC PANEL    Imaging Review No results found.   EKG Interpretation None       12:14 AM Patient seen and examined. Work-up initiated.    Vital signs reviewed and are as follows: BP 145/101 mmHg  Pulse 67  Temp(Src) 98 F (36.7 C) (Oral)  Resp 16  Ht 5' 6"  (1.676 m)  Wt 165 lb (74.844 kg)  BMI 26.64 kg/m2  SpO2 99%  1:18 AM Patient eloped.   MDM   Final diagnoses:  Bilateral lower extremity edema   Patient eloped prior to completion of eval.    Carlisle Cater, PA-C 05/27/15 0119  April Palumbo, MD 05/27/15 0134

## 2015-05-30 ENCOUNTER — Emergency Department (INDEPENDENT_AMBULATORY_CARE_PROVIDER_SITE_OTHER)
Admission: EM | Admit: 2015-05-30 | Discharge: 2015-05-30 | Disposition: A | Discharge status: Home / Self Care | Payer: No Typology Code available for payment source | Source: Home / Self Care | Attending: Family Medicine | Admitting: Family Medicine

## 2015-05-30 ENCOUNTER — Encounter (HOSPITAL_COMMUNITY): Payer: Self-pay | Admitting: Emergency Medicine

## 2015-05-30 ENCOUNTER — Ambulatory Visit: Payer: Self-pay | Attending: Internal Medicine

## 2015-05-30 ENCOUNTER — Ambulatory Visit: Payer: Self-pay | Admitting: Internal Medicine

## 2015-05-30 DIAGNOSIS — E1142 Type 2 diabetes mellitus with diabetic polyneuropathy: Secondary | ICD-10-CM

## 2015-05-30 DIAGNOSIS — R609 Edema, unspecified: Secondary | ICD-10-CM

## 2015-05-30 DIAGNOSIS — I152 Hypertension secondary to endocrine disorders: Secondary | ICD-10-CM

## 2015-05-30 DIAGNOSIS — E139 Other specified diabetes mellitus without complications: Secondary | ICD-10-CM

## 2015-05-30 LAB — CBC
HEMATOCRIT: 42.4 % (ref 36.0–46.0)
Hemoglobin: 14.2 g/dL (ref 12.0–15.0)
MCH: 30.3 pg (ref 26.0–34.0)
MCHC: 33.5 g/dL (ref 30.0–36.0)
MCV: 90.4 fL (ref 78.0–100.0)
MPV: 9.3 fL (ref 8.6–12.4)
PLATELETS: 234 10*3/uL (ref 150–400)
RBC: 4.69 MIL/uL (ref 3.87–5.11)
RDW: 13.8 % (ref 11.5–15.5)
WBC: 7.1 10*3/uL (ref 4.0–10.5)

## 2015-05-30 LAB — POCT I-STAT, CHEM 8
BUN: 6 mg/dL (ref 6–20)
Calcium, Ion: 1.14 mmol/L (ref 1.13–1.30)
Chloride: 105 mmol/L (ref 101–111)
Creatinine, Ser: 0.5 mg/dL (ref 0.44–1.00)
Glucose, Bld: 307 mg/dL — ABNORMAL HIGH (ref 65–99)
HCT: 44 % (ref 36.0–46.0)
HEMOGLOBIN: 15 g/dL (ref 12.0–15.0)
Potassium: 3.4 mmol/L — ABNORMAL LOW (ref 3.5–5.1)
SODIUM: 141 mmol/L (ref 135–145)
TCO2: 22 mmol/L (ref 0–100)

## 2015-05-30 LAB — LIPID PANEL
Cholesterol: 166 mg/dL (ref 0–200)
HDL: 28 mg/dL — ABNORMAL LOW (ref >=46)
LDL CALC: 108 mg/dL — AB (ref 0–99)
TRIGLYCERIDES: 151 mg/dL — AB (ref <150)
Total CHOL/HDL Ratio: 5.9 Ratio
VLDL: 30 mg/dL (ref 0–40)

## 2015-05-30 LAB — COMPLETE METABOLIC PANEL WITH GFR
ALBUMIN: 3.4 g/dL — AB (ref 3.5–5.2)
ALT: 8 U/L (ref 0–35)
AST: 12 U/L (ref 0–37)
Alkaline Phosphatase: 59 U/L (ref 39–117)
BUN: 9 mg/dL (ref 6–23)
CHLORIDE: 105 meq/L (ref 96–112)
CO2: 26 meq/L (ref 19–32)
CREATININE: 0.61 mg/dL (ref 0.50–1.10)
Calcium: 8.7 mg/dL (ref 8.4–10.5)
GFR, Est Non African American: >89 mL/min
Glucose, Bld: 220 mg/dL — ABNORMAL HIGH (ref 70–99)
POTASSIUM: 4.5 meq/L (ref 3.5–5.3)
SODIUM: 142 meq/L (ref 135–145)
Total Bilirubin: 0.7 mg/dL (ref 0.2–1.2)
Total Protein: 6.3 g/dL (ref 6.0–8.3)

## 2015-05-30 MED ORDER — POTASSIUM CHLORIDE ER 10 MEQ PO TBCR: EXTENDED_RELEASE_TABLET | ORAL | Status: DC

## 2015-05-30 MED ORDER — FUROSEMIDE 20 MG PO TABS: ORAL_TABLET | ORAL | Status: DC

## 2015-05-30 NOTE — ED Provider Notes (Signed)
CSN: [id number]     Arrival date & time 05/30/15  1300 History   First MD Initiated Contact with Patient 05/30/15 1322     Chief Complaint  Patient presents with  . 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 She denies chest pain or shortness of breath. She went to see her PCP this morning but was 15 minutes late and was not allowed to be evaluated. They did draw routine labs for her upcoming appointment in about 10 days.   Past Medical History  Diagnosis Date  . Anxiety   . Diabetes mellitus   . GERD (gastroesophageal reflux disease)   . Hyperlipidemia   . Menopause   . ADD (attention deficit disorder)   . ADHD (attention deficit hyperactivity disorder)    Past Surgical History  Procedure Laterality Date  . Mouth surgery  2010  . Gallbladder surgery  1993  . Varicose veins  2011and 1991  . Knee arthroscopy  2008 and 2009    both knees  . Wrist surgery      cyst removal  . Foot surgery  2012  . Hammertoe reconstruction with weil osteotomy  10/27/2012    Procedure: HAMMERTOE RECONSTRUCTION WITH WEIL OSTEOTOMY;  Surgeon: [name], MD;  Location: [facility];  Service: Orthopedics;  Laterality: Right;  Right Second, Third and Fouth  Hammertoe Correction; Right Second and Third Metatarsal Weil Osteotomy; Second and Third Dorsal Capsulotomy;  Second Flexor Extensor Transfer   Family History  Problem Relation Age of Onset  . Heart attack Maternal Grandfather   . Heart attack Father   . Cancer Mother     skin  . Cancer Maternal Uncle     skin  . Emphysema Maternal Aunt   . Schizophrenia Mother   . Depression Mother   . ADD / ADHD Son   . Diabetes Mother   . Hypertension Mother   .  Hypertension Maternal Grandfather    History  Substance Use Topics  . Smoking status: Former Smoker -- 10.50 packs/day for 30 years    Types: Cigarettes    Quit date: 06/02/2007  . Smokeless tobacco: Never Used  . Alcohol Use: No   OB History    Gravida Para Term Preterm AB TAB SAB Ectopic Multiple Living   _0 Review of Systems  Constitutional: Negative for fever, activity change and fatigue.  HENT: Negative.   Respiratory: Negative for cough, choking, shortness of breath and stridor.   Cardiovascular: Positive for leg swelling. Negative for chest pain and palpitations.  Gastrointestinal: Negative.   Neurological: Negative.     Allergies  Atorvastatin  Home Medications   Prior to Admission medications   Medication Sig Start Date End Date Taking? Authorizing Provider  Blood Glucose Monitoring Suppl (TRUE METRIX METER) W/DEVICE KIT Use as directed! 05/10/15   Lance Bosch, NP  furosemide (LASIX) 20 MG tablet One tablet po for 2 days, then 1/2 tablet q d prn leg edema. 05/30/15   Janne Napoleon, NP  glipiZIDE (GLUCOTROL) 5 MG tablet Take 1 tablet (5 mg total) by mouth 2 (two) times daily before a meal. 05/10/15   Lance Bosch, NP  glucose blood (ACCU-CHEK AVIVA) test strip Use as instructed 05/10/15  Lance Bosch, NP  glucose blood (TRUE METRIX BLOOD GLUCOSE TEST) test strip 1 each by Other route 3 (three) times daily. Use as instructed 05/10/15   Lance Bosch, NP  lisinopril (ZESTRIL) 2.5 MG tablet Take 1 tablet (2.5 mg total) by mouth daily. 05/10/15   Lance Bosch, NP  Multiple Vitamin (MULTIVITAMIN) tablet Take 1 tablet by mouth daily.    Historical Provider, MD  naproxen sodium (ANAPROX) 220 MG tablet Take 440 mg by mouth 2 (two) times daily as needed (pain).    Historical Provider, MD  omeprazole (PRILOSEC) 20 MG capsule Take 1 capsule (20 mg total) by mouth daily before breakfast. 05/10/15   Lance Bosch, NP  potassium chloride (K-DUR) 10 MEQ tablet Take 2  tablets per day for 3 days, then 1 q d while taking Lasix 05/30/15   Janne Napoleon, NP  TRUEPLUS LANCETS 28G MISC Use TID 05/10/15   Lance Bosch, NP  VITAMIN D, CHOLECALCIFEROL, PO Take 1 capsule by mouth daily.     Historical Provider, MD  zolpidem (AMBIEN) 10 MG tablet Take 10 mg by mouth at bedtime.  04/14/12   Historical Provider, MD   BP 127/61 mmHg  Pulse 105  Temp(Src) 99.9 F (37.7 C) (Oral)  Resp 18  SpO2 100% Physical Exam  Constitutional: She is oriented to person, place, and time. She appears well-developed and well-nourished. No distress.  Eyes: EOM are normal.  Neck: Normal range of motion. Neck supple.  Cardiovascular: Normal rate and regular rhythm.   Pulmonary/Chest: Effort normal and breath sounds normal. No respiratory distress.  Musculoskeletal: Normal range of motion. She exhibits edema.  Lower pretibial edema 2+. Bilateral pedal edema without pitting. Normal color and warmth. Unable to palpate pulses due to the edema. No open wounds, lesions or ulcers to the lower extremities. No calf edema or tenderness.   Lymphadenopathy:    She has no cervical adenopathy.  Neurological: She is alert and oriented to person, place, and time.  Skin: Skin is warm and dry.  Psychiatric: She has a normal mood and affect.  Nursing note and vitals reviewed.   ED Course  Procedures (including critical care time) Labs Review Labs Reviewed  POCT I-STAT, CHEM 8 - Abnormal; Notable for the following:    Potassium 3.4 (*)    Glucose, Bld 307 (*)    All other components within normal limits    Imaging Review No results found.   MDM   1. 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 Stay out of the [place], NP 05/30/15 1416

## 2015-05-30 NOTE — Discharge Instructions (Signed)
Peripheral Edema You have swelling in your legs (peripheral edema). This swelling is due to excess accumulation of salt and water in your body. Edema may be a sign of heart, kidney or liver disease, or a side effect of a medication. It may also be due to problems in the leg veins. Elevating your legs and using special support stockings may be very helpful, if the cause of the swelling is due to poor venous circulation. Avoid long periods of standing, whatever the cause. Treatment of edema depends on identifying the cause. Chips, pretzels, pickles and other salty foods should be avoided. Restricting salt in your diet is almost always needed. Water pills (diuretics) are often used to remove the excess salt and water from your body via urine. These medicines prevent the kidney from reabsorbing sodium. This increases urine flow. Diuretic treatment may also result in lowering of potassium levels in your body. Potassium supplements may be needed if you have to use diuretics daily. Daily weights can help you keep track of your progress in clearing your edema. You should call your caregiver for follow up care as recommended. SEEK IMMEDIATE MEDICAL CARE IF:   You have increased swelling, pain, redness, or heat in your legs.  You develop shortness of breath, especially when lying down.  You develop chest or abdominal pain, weakness, or fainting.  You have a fever. Document Released: 12/17/2004 Document Revised: 02/01/2012 Document Reviewed: 11/27/2009 [facility] Patient Information 2015 [place], [place]. This information is not intended to replace advice given to you by your health care provider. Make sure you discuss any questions you have with your health care provider.  High Blood Sugar High blood sugar (hyperglycemia) means that the level of sugar in your blood is higher than it should be. Signs of high blood sugar include:  Feeling thirsty.  Frequent peeing (urinating).  Feeling tired or  sleepy.  Dry mouth.  Vision changes.  Feeling weak.  Feeling hungry but losing weight.  Numbness and tingling in your hands or feet.  Headache. When you ignore these signs, your blood sugar may keep going up. These problems may get worse, and other problems may begin. HOME CARE  Check your blood sugars as told by your doctor. Write down the numbers with the date and time.  Take the right amount of insulin or diabetes pills at the right time. Write down the dose with date and time.  Refill your insulin or diabetes pills before running out.  Watch what you eat. Follow your meal plan.  Drink liquids without sugar, such as water. Check with your doctor if you have kidney or heart disease.  Follow your doctor's orders for exercise. Exercise at the same time of day.  Keep your doctor's appointments. GET HELP RIGHT AWAY IF:   You have trouble thinking or are confused.  You have fast breathing with fruity smelling breath.  You pass out (faint).  You have 2 to 3 days of high blood sugars and you do not know why.  You have chest pain.  You are feeling sick to your stomach (nauseous) or throwing up (vomiting).  You have sudden vision changes. MAKE SURE YOU:   Understand these instructions.  Will watch your condition.  Will get help right away if you are not doing well or get worse. Document Released: 09/06/2009 Document Revised: 02/01/2012 Document Reviewed: 09/06/2009 [facility] Patient Information 2015 [place], [place]. This information is not intended to replace advice given to you by your health care provider. Make sure you  discuss any questions you have with your health care provider.  Blood Glucose Monitoring Monitoring your blood glucose (also know as blood sugar) helps you to manage your diabetes. It also helps you and your health care provider monitor your diabetes and determine how well your treatment plan is working. WHY SHOULD YOU MONITOR YOUR BLOOD  GLUCOSE?  It can help you understand how food, exercise, and medicine affect your blood glucose.  It allows you to know what your blood glucose is at any given moment. You can quickly tell if you are having low blood glucose (hypoglycemia) or high blood glucose (hyperglycemia).  It can help you and your health care provider know how to adjust your medicines.  It can help you understand how to manage an illness or adjust medicine for exercise. WHEN SHOULD YOU TEST? Your health care provider will help you decide how often you should check your blood glucose. This may depend on the type of diabetes you have, your diabetes control, or the types of medicines you are taking. Be sure to write down all of your blood glucose readings so that this information can be reviewed with your health care provider. See below for examples of testing times that your health care provider may suggest. Type 1 Diabetes  Test 4 times a day if you are in good control, using an insulin pump, or perform multiple daily injections.  If your diabetes is not well controlled or if you are sick, you may need to monitor more often.  It is a good idea to also monitor:  Before and after exercise.  Between meals and 2 hours after a meal.  Occasionally between 2:00 a.m. and 3:00 a.m. Type 2 Diabetes  It can vary with each person, but generally, if you are on insulin, test 4 times a day.  If you take medicines by mouth (orally), test 2 times a day.  If you are on a controlled diet, test once a day.  If your diabetes is not well controlled or if you are sick, you may need to monitor more often. HOW TO MONITOR YOUR BLOOD GLUCOSE Supplies Needed  Blood glucose meter.  Test strips for your meter. Each meter has its own strips. You must use the strips that go with your own meter.  A pricking needle (lancet).  A device that holds the lancet (lancing device).  A journal or log book to write down your  results. Procedure  Wash your hands with soap and water. Alcohol is not preferred.  Prick the side of your finger (not the tip) with the lancet.  Gently milk the finger until a small drop of blood appears.  Follow the instructions that come with your meter for inserting the test strip, applying blood to the strip, and using your blood glucose meter. Other Areas to Get Blood for Testing Some meters allow you to use other areas of your body (other than your finger) to test your blood. These areas are called alternative sites. The most common alternative sites are:  The forearm.  The thigh.  The back area of the lower leg.  The palm of the hand. The blood flow in these areas is slower. Therefore, the blood glucose values you get may be delayed, and the numbers are different from what you would get from your fingers. Do not use alternative sites if you think you are having hypoglycemia. Your reading will not be accurate. Always use a finger if you are having hypoglycemia. Also, if you  cannot feel your lows (hypoglycemia unawareness), always use your fingers for your blood glucose checks. ADDITIONAL TIPS FOR GLUCOSE MONITORING  Do not reuse lancets.  Always carry your supplies with you.  All blood glucose meters have a 24-hour "hotline" number to call if you have questions or need help.  Adjust (calibrate) your blood glucose meter with a control solution after finishing a few boxes of strips. BLOOD GLUCOSE RECORD KEEPING It is a good idea to keep a daily record or log of your blood glucose readings. Most glucose meters, if not all, keep your glucose records stored in the meter. Some meters come with the ability to download your records to your home computer. Keeping a record of your blood glucose readings is especially helpful if you are wanting to look for patterns. Make notes to go along with the blood glucose readings because you might forget what happened at that exact time. Keeping  good records helps you and your health care provider to work together to achieve good diabetes management.  Document Released: 11/12/2003 Document Revised: 03/26/2014 Document Reviewed: 04/03/2013 [facility] Patient Information 2015 [place], [place]. This information is not intended to replace advice given to you by your health care provider. Make sure you discuss any questions you have with your health care provider.

## 2015-05-30 NOTE — ED Notes (Signed)
Bilateral legs and pedal edema for 4 days.  Feet swollen for 4 days, legs swollen for 2 days.  Patient sees a podiatrist tomorrow.  Patient is staying at a friends home as of last night.  Prior to last night has been sleeping in a car.

## 2015-05-31 ENCOUNTER — Ambulatory Visit: Payer: No Typology Code available for payment source | Admitting: Podiatry

## 2015-05-31 ENCOUNTER — Other Ambulatory Visit: Payer: Self-pay | Admitting: Internal Medicine

## 2015-05-31 ENCOUNTER — Ambulatory Visit (HOSPITAL_COMMUNITY)
Admission: RE | Admit: 2015-05-31 | Discharge: 2015-05-31 | Disposition: A | Discharge status: Home / Self Care | Payer: Self-pay | Source: Ambulatory Visit | Attending: Internal Medicine | Admitting: Internal Medicine

## 2015-05-31 ENCOUNTER — Telehealth: Payer: Self-pay | Admitting: Internal Medicine

## 2015-05-31 DIAGNOSIS — B351 Tinea unguium: Secondary | ICD-10-CM

## 2015-05-31 DIAGNOSIS — Z1239 Encounter for other screening for malignant neoplasm of breast: Secondary | ICD-10-CM

## 2015-05-31 DIAGNOSIS — Z1231 Encounter for screening mammogram for malignant neoplasm of breast: Secondary | ICD-10-CM

## 2015-05-31 DIAGNOSIS — E0949 Drug or chemical induced diabetes mellitus with neurological complications with other diabetic neurological complication: Secondary | ICD-10-CM

## 2015-05-31 DIAGNOSIS — G8928 Other chronic postprocedural pain: Secondary | ICD-10-CM

## 2015-05-31 LAB — VITAMIN D 25 HYDROXY (VIT D DEFICIENCY, FRACTURES): Vit D, 25-Hydroxy: 15 ng/mL — ABNORMAL LOW (ref 30–100)

## 2015-05-31 NOTE — Telephone Encounter (Signed)
Patient has come in today to request a consult with PCP because her podiatrist is unable to script her pain medications for her neuropathy in her feet; she also would request a diabetic eye exam; please f/u with patient

## 2015-06-02 NOTE — Progress Notes (Signed)
Subjective:     Patient ID: Wendy Underwood, female   DOB: 07/03/1951, 64 y.o.   MRN: [id number]  HPIThis patients presents to office for evaluation of her diabetic feet.  She was referred by Dr. Feliciana Rossetti.  She is concerned about the discoloration of her nails both feet.  No pain noted.  She also describes severe right foot pain that keps from walking on any given day.  She says she has surgery by Dr. Paulla Dolly in 2012 and Dr. Doran Durand in 2013 .  He preformed weil osteotomies 2,3 right foot with hammer toe correction 2,3,4 right foot.   She says she feels metal in her toes .  She says she has neuropathy from her lower legs into her toes.  She is diabetic with a HB1AC of 9/0.    Review of Systems     Objective:   Physical Exam Objective: Review of past medical history, medications, social history and allergies were performed.  Vascular: Dorsalis pedis and posterior tibial pulses were palpable B/L, capillary refill was  WNL B/L, temperature gradient was WNL B/L   Skin:  No signs of symptoms of infection or ulcers on both feet  Nails: thick disfigured discolored nails on both feet in absence of infection ,bacterial.  Sensory: Thornell Mule monifilament absent.   Orthopedic: Orthopedic evaluation demonstrates all joints distal t ankle have full ROM without crepitus, muscle power WNL B/L     Assessment:     Onychomycosis   DM with neuropathy.   Pain s/p foot surgery  Possible claudication legs B/L    Plan:     IE  Xray right foot.  Discussed neuropathy treatment with this patient.  Told her gabapentin is the treatment for neuropathy.  Told her to discuss with her medical doctor.

## 2015-06-03 NOTE — Telephone Encounter (Signed)
Pt was prescribed Zolpidem 10mg  once a day for sleep by another dr that is no longer in business. Pt is wondering if we can issue her a refill today for this medication although it was never prescribed by a provider here. Please f/u with pt.

## 2015-06-03 NOTE — Addendum Note (Signed)
Addended by: Cranford Mon R on: 06/03/2015 04:44 PM   Modules accepted: Medications

## 2015-06-05 ENCOUNTER — Other Ambulatory Visit: Payer: Self-pay | Admitting: Internal Medicine

## 2015-06-05 MED ORDER — VITAMIN D (ERGOCALCIFEROL) 1.25 MG (50000 UNIT) PO CAPS
50000.0000 [IU] | ORAL_CAPSULE | ORAL | Status: DC
Start: 2015-06-05 — End: 2018-09-05

## 2015-06-06 ENCOUNTER — Telehealth: Payer: Self-pay | Admitting: Internal Medicine

## 2015-06-06 ENCOUNTER — Encounter: Payer: Self-pay | Admitting: Internal Medicine

## 2015-06-06 ENCOUNTER — Ambulatory Visit: Payer: No Typology Code available for payment source | Attending: Internal Medicine | Admitting: Internal Medicine

## 2015-06-06 VITALS — BP 131/76 | HR 66 | Temp 97.3°F | Resp 18 | Ht 66.0 in | Wt 170.2 lb

## 2015-06-06 DIAGNOSIS — E139 Other specified diabetes mellitus without complications: Secondary | ICD-10-CM

## 2015-06-06 DIAGNOSIS — F909 Attention-deficit hyperactivity disorder, unspecified type: Secondary | ICD-10-CM | POA: Insufficient documentation

## 2015-06-06 DIAGNOSIS — Z79899 Other long term (current) drug therapy: Secondary | ICD-10-CM | POA: Insufficient documentation

## 2015-06-06 DIAGNOSIS — I8390 Asymptomatic varicose veins of unspecified lower extremity: Secondary | ICD-10-CM | POA: Insufficient documentation

## 2015-06-06 DIAGNOSIS — G4700 Insomnia, unspecified: Secondary | ICD-10-CM | POA: Insufficient documentation

## 2015-06-06 DIAGNOSIS — K219 Gastro-esophageal reflux disease without esophagitis: Secondary | ICD-10-CM | POA: Insufficient documentation

## 2015-06-06 DIAGNOSIS — M7989 Other specified soft tissue disorders: Secondary | ICD-10-CM | POA: Insufficient documentation

## 2015-06-06 DIAGNOSIS — Z87891 Personal history of nicotine dependence: Secondary | ICD-10-CM | POA: Insufficient documentation

## 2015-06-06 DIAGNOSIS — E1165 Type 2 diabetes mellitus with hyperglycemia: Secondary | ICD-10-CM | POA: Insufficient documentation

## 2015-06-06 DIAGNOSIS — R609 Edema, unspecified: Secondary | ICD-10-CM | POA: Insufficient documentation

## 2015-06-06 DIAGNOSIS — F419 Anxiety disorder, unspecified: Secondary | ICD-10-CM | POA: Insufficient documentation

## 2015-06-06 DIAGNOSIS — M25572 Pain in left ankle and joints of left foot: Secondary | ICD-10-CM | POA: Insufficient documentation

## 2015-06-06 DIAGNOSIS — G629 Polyneuropathy, unspecified: Secondary | ICD-10-CM | POA: Insufficient documentation

## 2015-06-06 DIAGNOSIS — R739 Hyperglycemia, unspecified: Secondary | ICD-10-CM

## 2015-06-06 DIAGNOSIS — E785 Hyperlipidemia, unspecified: Secondary | ICD-10-CM | POA: Insufficient documentation

## 2015-06-06 LAB — GLUCOSE, POCT (MANUAL RESULT ENTRY): POC GLUCOSE: 304 mg/dL — AB (ref 70–99)

## 2015-06-06 MED ORDER — GABAPENTIN 100 MG PO CAPS: 100.0000 mg | ORAL_CAPSULE | Freq: Three times a day (TID) | ORAL | Status: DC

## 2015-06-06 MED ORDER — INSULIN ASPART 100 UNIT/ML ~~LOC~~ SOLN: 10.0000 [IU] | Freq: Once | SUBCUTANEOUS | Status: DC

## 2015-06-06 NOTE — Patient Instructions (Signed)
Continue Melatonin for sleep and Gabapentin will help as well.  Try this current dose of gabapentin if it does not help with the pain we can go up on the dose

## 2015-06-06 NOTE — Telephone Encounter (Signed)
No. She can try Melatonin for sleep. We discussed this at her office visit

## 2015-06-06 NOTE — Telephone Encounter (Signed)
Patient in clinic today, provider talked to patient about message.

## 2015-06-06 NOTE — Progress Notes (Signed)
Patient here with complaints of right foot pain/swelling.  Patient seen in Urgent Care last week and given Lasix and Potassium Chloride.    CBG: 304, not fasting

## 2015-06-06 NOTE — Telephone Encounter (Signed)
-----   Message from Tresa Garter, MD sent at 06/05/2015 12:12 PM EDT ----- Please inform patient that her laboratory test results shows low vitamin D level that needs to be replaced, slightly higher cholesterol level, and high blood sugar. We will prescribe vitamin D capsule to be taken weekly for 12 weeks, and low-dose cholesterol-lowering medication because of history of diabetes. Encouraged patient to continue blood sugar control, low carbohydrate, low sugar diet and regular physical exercise as tolerated. Medications have been eprescribed to the pharmacy for pickup.

## 2015-06-06 NOTE — Telephone Encounter (Signed)
Pt is interested in ordering compression socks but they are not in stock in our pharmacy. Please follow up with pt if this could be ordered for her elsewhere. Thank you.

## 2015-06-06 NOTE — Progress Notes (Signed)
Patient ID: Wendy Underwood, female   DOB: [birth year], 64 y.o.   MRN: [id number]  CC: swelling hands and feet   HPI: Wendy Underwood is a 64 y.o. female here today for a follow up visit.  Patient has past medical history of T2DM, HTN, GERD, HLD, ADHD. She reports that she was seen by the ER and then by urgent care last week for BLE edema and was given lasix and potassium. She has reportedly been sleeping in a car for several days due to being homeless but she has been able to stay at a friend house for the past 2 days. She now notes that the swelling has improved since taking lasix and using her compression hose.         C/o of BLE neuropathy that is more intense at nighttime as well. She reports that she has been evaluated by a neurologist several years before being diagnosed with diabetes and was never told what caused her neuropathy. Pain in her ankle that has been tender to touch.  Has noticed decreased swelling and tenderness. She notes a history of gout in the past.  Patient reports that she has been on Ambien for several years for sleep and that is the only thing that helps her sleep. She reports only 4 hours of sleep per night. She has only tried Melatonin 3 mg for two nights without relief.   Patient has No headache, No chest pain, No abdominal pain - No Nausea, No new weakness tingling or numbness, No Cough - SOB.  Allergies  Allergen Reactions  . Atorvastatin Other (See Comments)    myalgia   Past Medical History  Diagnosis Date  . Anxiety   . Diabetes mellitus   . GERD (gastroesophageal reflux disease)   . Hyperlipidemia   . Menopause   . ADD (attention deficit disorder)   . ADHD (attention deficit hyperactivity disorder)    Current Outpatient Prescriptions on File Prior to Visit  Medication Sig Dispense Refill  . Blood Glucose Monitoring Suppl (TRUE METRIX METER) W/DEVICE KIT Use as directed! 1 kit 0  . furosemide (LASIX) 20 MG tablet One tablet po for 2 days, then 1/2  tablet q d prn leg edema. 20 tablet Locating the  . glipiZIDE (GLUCOTROL) 5 MG tablet Take 1 tablet (5 mg total) by mouth 2 (two) times daily before a meal. 60 tablet 4  . glucose blood (ACCU-CHEK AVIVA) test strip Use as instructed 100 each 12  . glucose blood (TRUE METRIX BLOOD GLUCOSE TEST) test strip 1 each by Other route 3 (three) times daily. Use as instructed 100 each 12  . lisinopril (ZESTRIL) 2.5 MG tablet Take 1 tablet (2.5 mg total) by mouth daily. 30 tablet 4  . Multiple Vitamin (MULTIVITAMIN) tablet Take 1 tablet by mouth daily.    . naproxen sodium (ANAPROX) 220 MG tablet Take 440 mg by mouth 2 (two) times daily as needed (pain).    Marland Kitchen omeprazole (PRILOSEC) 20 MG capsule Take 1 capsule (20 mg total) by mouth daily before breakfast. 30 capsule 4  . potassium chloride (K-DUR) 10 MEQ tablet Take 2 tablets per day for 3 days, then 1 q d while taking Lasix 24 tablet 0  . TRUEPLUS LANCETS 28G MISC Use TID 100 each 11  . Vitamin D, Ergocalciferol, (DRISDOL) 50000 UNITS CAPS capsule Take 1 capsule (50,000 Units total) by mouth every 7 (seven) days. 12 capsule 0  . zolpidem (AMBIEN) 10 MG tablet Take 10 mg by mouth at  bedtime.     . [DISCONTINUED] metFORMIN (GLUMETZA) 500 MG (MOD) 24 hr tablet Take 1 tablet (500 mg total) by mouth daily with breakfast. 90 tablet 3   No current facility-administered medications on file prior to visit.   Family History  Problem Relation Age of Onset  . Heart attack Maternal Grandfather   . Heart attack Father   . Cancer Mother     skin  . Cancer Maternal Uncle     skin  . Emphysema Maternal Aunt   . Schizophrenia Mother   . Depression Mother   . ADD / ADHD Son   . Diabetes Mother   . Hypertension Mother   . Hypertension Maternal Grandfather    History   Social History  . Marital Status: Divorced    Spouse Name: N/A  . Number of Children: N/A  . Years of Education: N/A   Occupational History  . Not on file.   Social History Main Topics  .  Smoking status: Former Smoker -- 10.50 packs/day for 30 years    Types: Cigarettes    Quit date: 06/02/2007  . Smokeless tobacco: Never Used  . Alcohol Use: No  . Drug Use: No  . Sexual Activity: No   Other Topics Concern  . Not on file   Social History Narrative    Review of Systems: See HPI   Objective:   Filed Vitals:   06/06/15 0931  BP: 131/76  Pulse: 66  Temp: 97.3 F (36.3 C)  Resp: 18    Physical Exam  Constitutional: She is oriented to person, place, and time.  Cardiovascular: Normal rate, regular rhythm and normal heart sounds.   Pulmonary/Chest: Effort normal and breath sounds normal.  Musculoskeletal: She exhibits tenderness (left ankle). She exhibits no edema (none noted in hands or feet).  Neurological: She is alert and oriented to person, place, and time.    Lab Results  Component Value Date   WBC 7.1 05/30/2015   HGB 15.0 05/30/2015   HCT 44.0 05/30/2015   MCV 90.4 05/30/2015   PLT 234 05/30/2015   Lab Results  Component Value Date   CREATININE 0.50 05/30/2015   BUN 6 05/30/2015   NA 141 05/30/2015   K 3.4* 05/30/2015   CL 105 05/30/2015   CO2 26 05/30/2015    Lab Results  Component Value Date   HGBA1C 9.0 05/10/2015   Lipid Panel     Component Value Date/Time   CHOL 166 05/30/2015 0926   TRIG 151* 05/30/2015 0926   HDL 28* 05/30/2015 0926   CHOLHDL 5.9 05/30/2015 0926   VLDL 30 05/30/2015 0926   LDLCALC 108* 05/30/2015 0926       Assessment and plan:   Zyaira was seen today for foot pain and foot swelling.  Diagnoses and all orders for this visit:  Diabetes 1.5, managed as type 2 Orders: -     Glucose (CBG) -     insulin aspart (novoLOG) injection 10 Units; Inject 0.1 mLs (10 Units total) into the skin once. Continue current medication regimen  Hyperglycemia    Patient reports BS was 179 this morning and she does not feel comfortable takign inuslin today  Neuropathy Orders: -     Begin gabapentin (NEURONTIN) 100  MG capsule; Take 1 capsule (100 mg total) by mouth 3 (three) times daily. If no improvement in neuropathy in 3-4 weeks I will increase dose at that time.  Insomnia  I have again explained to patient that I will  not refill Ambien but she may try Melatonin 6 mg per night. I have also explained that I have added gabapentin for neuropathy but this may help with her sleep as well.   Peripheral edema None noted today. Encouraged decreased salt intake, elevated legs when able, stay out of heat for prolong periods of time. May use compression hose when standing for long periods especially with history of varicose veins  Left ankle pain Patient refused labs to check for uric acid and potassium level. Encouraged Tylenol for pain   Return for as directed .    Lance Bosch, [facility] [phone] 06/06/2015, 9:30 AM

## 2015-06-07 NOTE — Telephone Encounter (Signed)
LVM to return call.

## 2015-06-10 ENCOUNTER — Telehealth: Payer: Self-pay | Admitting: Clinical

## 2015-06-10 NOTE — Telephone Encounter (Signed)
F/u with pt; pt states she will make an appointment to come in asap

## 2015-06-24 ENCOUNTER — Telehealth: Payer: Self-pay | Admitting: Internal Medicine

## 2015-06-24 NOTE — Telephone Encounter (Signed)
Patient called to request a medication to treat her fungus on her left foot, patient stated that she has went to the podiatrist as well as our facility but has not gotten anything to treat. Please f/u with pt.

## 2015-06-27 ENCOUNTER — Telehealth: Payer: Self-pay | Admitting: General Practice

## 2015-06-27 NOTE — Telephone Encounter (Signed)
Patient presents to clinic to follow up on opthalmology and colonoscopy referrals. Patient states she was seen for these at her first OV but has not heard anything back. Checked EPIC, no information found in referral tab. (other than podiatry) Please assist.

## 2015-07-01 ENCOUNTER — Ambulatory Visit: Payer: No Typology Code available for payment source | Attending: Internal Medicine | Admitting: Internal Medicine

## 2015-07-01 ENCOUNTER — Encounter: Payer: Self-pay | Admitting: Internal Medicine

## 2015-07-01 ENCOUNTER — Other Ambulatory Visit (HOSPITAL_COMMUNITY)
Admission: RE | Admit: 2015-07-01 | Discharge: 2015-07-01 | Disposition: A | Discharge status: Home / Self Care | Payer: No Typology Code available for payment source | Source: Ambulatory Visit | Attending: Internal Medicine | Admitting: Internal Medicine

## 2015-07-01 VITALS — BP 145/62 | HR 90 | Temp 98.0°F | Resp 16 | Ht 66.0 in | Wt 173.8 lb

## 2015-07-01 DIAGNOSIS — N760 Acute vaginitis: Secondary | ICD-10-CM | POA: Insufficient documentation

## 2015-07-01 DIAGNOSIS — Z113 Encounter for screening for infections with a predominantly sexual mode of transmission: Secondary | ICD-10-CM | POA: Insufficient documentation

## 2015-07-01 DIAGNOSIS — Z01419 Encounter for gynecological examination (general) (routine) without abnormal findings: Secondary | ICD-10-CM | POA: Insufficient documentation

## 2015-07-01 DIAGNOSIS — Z1151 Encounter for screening for human papillomavirus (HPV): Secondary | ICD-10-CM | POA: Insufficient documentation

## 2015-07-01 DIAGNOSIS — G629 Polyneuropathy, unspecified: Secondary | ICD-10-CM | POA: Insufficient documentation

## 2015-07-01 DIAGNOSIS — Z124 Encounter for screening for malignant neoplasm of cervix: Secondary | ICD-10-CM

## 2015-07-01 MED ORDER — GABAPENTIN 300 MG PO CAPS: 300.0000 mg | ORAL_CAPSULE | Freq: Three times a day (TID) | ORAL | Status: DC

## 2015-07-01 NOTE — Progress Notes (Signed)
   Subjective:    Patient ID: Wendy Underwood, female    DOB: 01-19-1951, 64 y.o.   MRN: [id number]  Gynecologic Exam The patient's pertinent negatives include no genital itching, genital lesions, genital odor, pelvic pain or vaginal discharge. She is not pregnant. She is sexually active. No, her partner does not have an STD. She is postmenopausal.    Review of Systems  Genitourinary: Negative for vaginal discharge and pelvic pain.       Objective:   Physical Exam  Genitourinary: Vagina normal and uterus normal. Cervix exhibits no motion tenderness, no discharge and no friability. Right adnexum displays no tenderness. Left adnexum displays no tenderness.  Lymphadenopathy:       Right: No inguinal adenopathy present.       Left: No inguinal adenopathy present.       Assessment & Plan:  Wendy Underwood was seen today for follow-up.  Diagnoses and all orders for this visit:  Papanicolaou smear Orders: -     Cytology - PAP [place]  Neuropathy Orders: -     gabapentin (NEURONTIN) 300 MG capsule; Take 1 capsule (300 mg total) by mouth 3 (three) times daily. Patient states that 100 mg TID was not helping pain so I have increased her to 300 mg TID. She will call back as needed.   Return if symptoms worsen or fail to improve.  Wendy Bosch, NP 07/01/2015 3:29 PM

## 2015-07-01 NOTE — Progress Notes (Signed)
Patient states here for pap test only Has no complaints at this time

## 2015-07-01 NOTE — Patient Instructions (Signed)
I have increased your gabapentin to 300 mg. You may take this 3 times a day. This should give you a little better symptom control over neuropathy pain

## 2015-07-02 LAB — CERVICOVAGINAL ANCILLARY ONLY
Chlamydia: NEGATIVE
Neisseria Gonorrhea: NEGATIVE

## 2015-07-02 LAB — CYTOLOGY - PAP

## 2015-07-03 LAB — CERVICOVAGINAL ANCILLARY ONLY: Wet Prep (BD Affirm): NEGATIVE

## 2015-07-04 ENCOUNTER — Telehealth: Payer: Self-pay

## 2015-07-04 DIAGNOSIS — B351 Tinea unguium: Secondary | ICD-10-CM

## 2015-07-04 NOTE — Telephone Encounter (Signed)
Please place GI referral--then click colonoscopy at bottom. Please explain that this takes time for appointment since she is uninsured they only accept a limited number of patients per month. There is not a eye doctor on orange card or discount. She may try [facility] they begin at $65 for basic exam. Thanks

## 2015-07-04 NOTE — Telephone Encounter (Signed)
Patient called and left message on nurse's voicemail. Nurse called patient, patient verified date of birth. Patient is upset because she was supposed to have a podiatry, eye and colonoscopy referrals. Patient went to podiatrist but was not prescribed any medication for her foot fungus. Nurse explained to patient to call podiatry office to request medication. Patient explains podiatrist would not give her any medication. Patient also requesting eye and colonoscopy referrals.  Patient reports that [facility] was setting up an appointment with Dr. Amalia Hailey at [facility] to go to a different podiatrist.  Nurse will send message to provider.

## 2015-07-08 ENCOUNTER — Telehealth: Payer: Self-pay

## 2015-07-08 NOTE — Telephone Encounter (Signed)
-----   Message from Lance Bosch, NP sent at 07/04/2015  8:17 AM EDT ----- positive for high risk HPV. Please explain that this is a virus that is sexually transmitted. The virus can cause warts or cervical cancer. Due to this risk she will need to have a Colposcopy scheduled with [facility]. A colpo is where they go in to take a small piece of the cervix to send off to evaluate for cancer No STD's or infections

## 2015-07-08 NOTE — Telephone Encounter (Signed)
Patient not available Left message on voice mail to return our call 

## 2015-07-09 ENCOUNTER — Other Ambulatory Visit: Payer: Self-pay | Admitting: Internal Medicine

## 2015-07-09 DIAGNOSIS — B351 Tinea unguium: Secondary | ICD-10-CM

## 2015-07-09 MED ORDER — TERBINAFINE HCL 250 MG PO TABS: 250.0000 mg | ORAL_TABLET | Freq: Every day | ORAL | Status: DC

## 2015-07-09 NOTE — Telephone Encounter (Signed)
Nurse called patient, patient verified date of birth. Patient aware of Lamisil prescription sent to pharmacy, Patient agrees to make lab appointment when she picks up prescription tomorrow. Patient aware of needing lab appointment in 30 days from initiating lamisil.  Patient voices understanding and has no further questions at this time.

## 2015-07-09 NOTE — Telephone Encounter (Signed)
Patient came into office in response to several phone calls Patient is aware of her recent pap results and quite shocked at her results of Positive high risk HPV Patient is aware of her scheduled colposcopy for thurs. 8/18 @ 9:30am  At [facility] practice [address] and was given the number In case she needs to reschedule that appt [phone]

## 2015-07-09 NOTE — Telephone Encounter (Signed)
Patient came into clinic, patient verified date of birth. Patient aware of colonoscopy referral being placed. Nurse explained referrals take a while once they are placed.  Patient aware of no eye doctor on [facility] or [facility]. Patient upset because [facility] eye doctors. Nurse explained not all [facility] doctors accept [facility] and [facility] discount.  Nurse explained to patient she can go to [facility] doctor for eye exam, they begin at $65.  Patient requesting medication for foot fungus.  Nurse spoke to [place]. Valerie requested nurse to send Lamisil 250mg , 1 tab daily, quantity 30 with no refills. [facility] will refill 2 more months of Lamisil after patient has blood work at end of first 30 days of Lamisil treatment.  Nurse will call patient.

## 2015-07-11 ENCOUNTER — Ambulatory Visit: Payer: No Typology Code available for payment source

## 2015-07-18 ENCOUNTER — Ambulatory Visit: Payer: No Typology Code available for payment source

## 2015-07-25 ENCOUNTER — Telehealth: Payer: Self-pay | Admitting: *Deleted

## 2015-07-25 NOTE — Telephone Encounter (Signed)
-----   Message from Nila Nephew, RN sent at 07/05/2015  3:10 PM EDT -----   ----- Message -----    From: Tresa Garter, MD    Sent: 06/05/2015  12:04 PM      To: Nila Nephew, RN  Please inform patient that her mammogram shows no evidence of malignancy. Recommend screening mammogram in one year.

## 2015-07-25 NOTE — Telephone Encounter (Signed)
Verified name and date of birth and patient states she got a letter in the mail with her mammography results.  No further questions answered

## 2015-08-01 ENCOUNTER — Ambulatory Visit: Payer: No Typology Code available for payment source

## 2015-08-08 ENCOUNTER — Ambulatory Visit: Payer: No Typology Code available for payment source

## 2015-08-15 ENCOUNTER — Other Ambulatory Visit: Payer: No Typology Code available for payment source

## 2017-05-21 ENCOUNTER — Encounter (HOSPITAL_BASED_OUTPATIENT_CLINIC_OR_DEPARTMENT_OTHER): Payer: Self-pay | Admitting: Emergency Medicine

## 2017-05-21 ENCOUNTER — Emergency Department (HOSPITAL_BASED_OUTPATIENT_CLINIC_OR_DEPARTMENT_OTHER)
Admission: EM | Admit: 2017-05-21 | Discharge: 2017-05-21 | Disposition: A | Discharge status: Home / Self Care | Payer: No Typology Code available for payment source | Attending: Emergency Medicine | Admitting: Emergency Medicine

## 2017-05-21 DIAGNOSIS — E1140 Type 2 diabetes mellitus with diabetic neuropathy, unspecified: Secondary | ICD-10-CM | POA: Diagnosis not present

## 2017-05-21 DIAGNOSIS — Z7984 Long term (current) use of oral hypoglycemic drugs: Secondary | ICD-10-CM | POA: Diagnosis not present

## 2017-05-21 DIAGNOSIS — Z794 Long term (current) use of insulin: Secondary | ICD-10-CM | POA: Insufficient documentation

## 2017-05-21 DIAGNOSIS — J029 Acute pharyngitis, unspecified: Secondary | ICD-10-CM

## 2017-05-21 DIAGNOSIS — F17200 Nicotine dependence, unspecified, uncomplicated: Secondary | ICD-10-CM | POA: Insufficient documentation

## 2017-05-21 DIAGNOSIS — Z79899 Other long term (current) drug therapy: Secondary | ICD-10-CM | POA: Diagnosis not present

## 2017-05-21 LAB — RAPID STREP SCREEN (MED CTR MEBANE ONLY): STREPTOCOCCUS, GROUP A SCREEN (DIRECT): NEGATIVE

## 2017-05-21 MED ORDER — PSEUDOEPHEDRINE HCL ER 120 MG PO TB12: 120.0000 mg | ORAL_TABLET | Freq: Two times a day (BID) | ORAL | 0 refills | Status: DC

## 2017-05-21 MED ORDER — NAPROXEN 500 MG PO TABS: 500.0000 mg | ORAL_TABLET | Freq: Two times a day (BID) | ORAL | 0 refills | Status: DC

## 2017-05-21 MED FILL — SM NASAL DECONGEST ER 120 M: 120 | 7 days supply | Qty: 14 | Fill #0

## 2017-05-21 MED FILL — NAPROXEN 500 MG TABLET: 500 | 10 days supply | Qty: 20 | Fill #0

## 2017-05-21 NOTE — Discharge Instructions (Signed)
Strep test was negative. Follow-up with your primary doctor if the symptoms are not improving over the next week.  Try using over-the-counter medications such as Cepacol to help with the throat discomfort

## 2017-05-21 NOTE — ED Triage Notes (Signed)
Patient states that she has had a sore throat and nasal congestion for the last 3 days

## 2017-05-21 NOTE — ED Provider Notes (Signed)
[facility] Provider Note   CSN: [id number] Arrival date & time: 05/21/17  1030     History   Chief Complaint Chief Complaint  Patient presents with  . Sore Throat    HPI Wendy Underwood is a 66 y.o. female.  HPI Patient presents to the emergency room for complaints of sore throat, hoarseness and nasal congestion. Patient states his symptoms started about 3 days ago. She is having pain in the back of her tongue and her throat. It hurts for her to swallow. She has also noticed hoarseness in her voice. She denies any difficulty breathing. She denies any difficulty swallowing. She has not measured any fevers. She also is having some nasal congestion and postnasal drainage.  No known fevers or chills. 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 Past Medical History:  Diagnosis Date  . ADD (attention deficit disorder)   . ADHD (attention deficit hyperactivity disorder)   . Anxiety   . Diabetes mellitus   . GERD (gastroesophageal reflux disease)   . Hyperlipidemia   . Menopause     Patient Active Problem List   Diagnosis Date Noted  . Onychomycosis 07/09/2015  . Neuropathy 06/06/2015  . Diabetes 1.5, managed as type 2 ([place]) 07/07/2012  . Personal history of DVT (deep vein thrombosis) 07/04/2012  . Anxiety 06/01/2012  . GERD (gastroesophageal reflux disease) 06/01/2012  . Other and unspecified hyperlipidemia 06/01/2012  . Menopause 06/01/2012  . Varicose veins 06/01/2012  . DJD (degenerative joint disease) of knee 06/01/2012    Past Surgical History:  Procedure Laterality Date  . FOOT SURGERY  2012  . [place]  . HAMMERTOE RECONSTRUCTION WITH WEIL OSTEOTOMY  10/27/2012   Procedure: HAMMERTOE RECONSTRUCTION WITH WEIL OSTEOTOMY;  Surgeon: [name], MD;  Location: [facility];  Service: Orthopedics;  Laterality: Right;  Right Second, Third and Fouth  Hammertoe Correction; Right Second and Third  Metatarsal Weil Osteotomy; Second and Third Dorsal Capsulotomy;  Second Solicitor  . KNEE ARTHROSCOPY  2008 and 2009   both knees  . MOUTH SURGERY  2010  . varicose veins  2011and 1991  . WRIST SURGERY     cyst removal    OB History    Gravida Para Term Preterm AB Living   _0 SAB TAB Ectopic Multiple Live Births                   Home Medications    Prior to Admission medications   Medication Sig Start Date End Date Taking? Authorizing Provider  Blood Glucose Monitoring Suppl (TRUE METRIX METER) W/DEVICE KIT Use as directed! 05/10/15   Lance Bosch, NP  furosemide (LASIX) 20 MG tablet One tablet po for 2 days, then 1/2 tablet q d prn leg edema. 05/30/15   Janne Napoleon, NP  gabapentin (NEURONTIN) 300 MG capsule Take 1 capsule (300 mg total) by mouth 3 (three) times daily. 07/01/15   Lance Bosch, NP  glipiZIDE (GLUCOTROL) 5 MG tablet Take 1 tablet (5 mg total) by mouth 2 (two) times daily before a meal. 05/10/15   Lance Bosch, NP  glucose blood (ACCU-CHEK AVIVA) test strip Use as instructed 05/10/15   Chari Manning A, NP  glucose blood (TRUE METRIX BLOOD GLUCOSE TEST) test strip 1 each by Other route 3 (three) times daily. Use as instructed 05/10/15   Lance Bosch,  NP  lisinopril (ZESTRIL) 2.5 MG tablet Take 1 tablet (2.5 mg total) by mouth daily. 05/10/15   Lance Bosch, NP  Multiple Vitamin (MULTIVITAMIN) tablet Take 1 tablet by mouth daily.    [provider]  naproxen (NAPROSYN) 500 MG tablet Take 1 tablet (500 mg total) by mouth 2 (two) times daily. 05/21/17   Dorie Rank, MD  naproxen sodium (ANAPROX) 220 MG tablet Take 440 mg by mouth 2 (two) times daily as needed (pain).    [provider]  omeprazole (PRILOSEC) 20 MG capsule Take 1 capsule (20 mg total) by mouth daily before breakfast. 05/10/15   Lance Bosch, NP  potassium chloride (K-DUR) 10 MEQ tablet Take 2 tablets per day for 3 days, then 1 q d while taking Lasix 05/30/15    Janne Napoleon, NP  pseudoephedrine (SUDAFED 12 HOUR) 120 MG 12 hr tablet Take 1 tablet (120 mg total) by mouth 2 (two) times daily. 05/21/17   Dorie Rank, MD  terbinafine (LAMISIL) 250 MG tablet Take 1 tablet (250 mg total) by mouth daily. 07/09/15   Lance Bosch, NP  TRUEPLUS LANCETS 28G MISC Use TID 05/10/15   Lance Bosch, NP  Vitamin D, Ergocalciferol, (DRISDOL) 50000 UNITS CAPS capsule Take 1 capsule (50,000 Units total) by mouth every 7 (seven) days. Patient not taking: Reported on 06/06/2015 06/05/15   Tresa Garter, MD  zolpidem (AMBIEN) 10 MG tablet Take 10 mg by mouth at bedtime.  04/14/12   [provider]    Family History Family History  Problem Relation Age of Onset  . Heart attack Maternal Grandfather   . Hypertension Maternal Grandfather   . Heart attack Father   . Cancer Mother        skin  . Schizophrenia Mother   . Depression Mother   . Diabetes Mother   . Hypertension Mother   . Cancer Maternal Uncle        skin  . Emphysema Maternal Aunt   . ADD / ADHD Son     Social History Social History  Substance Use Topics  . Smoking status: Former Smoker    Packs/day: 10.50    Years: 30.00    Types: Cigarettes    Quit date: 06/02/2007  . Smokeless tobacco: Never Used  . Alcohol use No     Allergies   Atorvastatin   Review of Systems Review of Systems  All other systems reviewed and are negative.    Physical Exam Updated Vital Signs BP (!) 142/75 (BP Location: Right Arm)   Pulse 86   Temp 98.7 F (37.1 C) (Oral)   Resp 16   Ht 1.651 m (5' 5")   Wt 74.3 kg (163 lb 12.8 oz)   SpO2 100%   BMI 27.26 kg/m   Physical Exam  Constitutional: She appears well-developed and well-nourished. No distress.  HENT:  Head: Normocephalic and atraumatic.  Right Ear: External ear normal.  Left Ear: External ear normal.  Mouth/Throat: Mucous membranes are normal. No uvula swelling. Posterior oropharyngeal erythema present. No oropharyngeal exudate  or posterior oropharyngeal edema.  Cerumen bilaterally (pt uses q tips, discussed use)  Eyes: Conjunctivae are normal. Right eye exhibits no discharge. Left eye exhibits no discharge. No scleral icterus.  Neck: Neck supple. No tracheal deviation present.  Cardiovascular: Normal rate, regular rhythm and intact distal pulses.   Pulmonary/Chest: Effort normal and breath sounds normal. No stridor. No respiratory distress. She has no wheezes. She has no rales.  Abdominal: Soft. Bowel sounds are normal. She exhibits no distension. There is no tenderness. There is no rebound and no guarding.  Musculoskeletal: She exhibits no edema or tenderness.  Neurological: She is alert. She has normal strength. No cranial nerve deficit (no facial droop, extraocular movements intact, no slurred speech) or sensory deficit. She exhibits normal muscle tone. She displays no seizure activity. Coordination normal.  Skin: Skin is warm and dry. No rash noted.  Psychiatric: She has a normal mood and affect.  Nursing note and vitals reviewed.    ED Treatments / Results  Labs (all labs ordered are listed, but only abnormal results are displayed) Labs Reviewed  RAPID STREP SCREEN (NOT AT [facility])  CULTURE, GROUP A STREP [facility])   Procedures Procedures (including critical care time)  Medications Ordered in ED Medications - No data to display   Initial Impression / Assessment and Plan / ED Course  I have reviewed the triage vital signs and the nursing notes.  Pertinent labs & imaging results that were available during my care of the patient were reviewed by me and considered in my medical decision making (see chart for details).    Symptoms are consistent with a viral upper respiratory infection. There is no evidence to suggest pneumonia on my exam. The patient does not appear to have an otitis media. I discussed supportive treatment. I encouraged followup with the primary care doctor next week if symptoms have not  resolved. Warning signs and reasons to return to the emergency room were discussed    Final Clinical Impressions(s) / ED Diagnoses   Final diagnoses:  Pharyngitis, unspecified etiology    New Prescriptions New Prescriptions   NAPROXEN (NAPROSYN) 500 MG TABLET    Take 1 tablet (500 mg total) by mouth 2 (two) times daily.   PSEUDOEPHEDRINE (SUDAFED 12 HOUR) 120 MG 12 HR TABLET    Take 1 tablet (120 mg total) by mouth 2 (two) times daily.     Dorie Rank, MD 05/21/17 [phone]

## 2017-05-23 LAB — CULTURE, GROUP A STREP (THRC)

## 2018-09-02 ENCOUNTER — Inpatient Hospital Stay (HOSPITAL_COMMUNITY)
Admission: EM | Admit: 2018-09-02 | Discharge: 2018-09-05 | DRG: 066 | Disposition: A | Discharge status: Home Health | Payer: PRIVATE HEALTH INSURANCE | Attending: Internal Medicine | Admitting: Internal Medicine

## 2018-09-02 ENCOUNTER — Encounter (HOSPITAL_COMMUNITY): Payer: Self-pay

## 2018-09-02 ENCOUNTER — Other Ambulatory Visit: Payer: Self-pay

## 2018-09-02 DIAGNOSIS — Z888 Allergy status to other drugs, medicaments and biological substances status: Secondary | ICD-10-CM

## 2018-09-02 DIAGNOSIS — I10 Essential (primary) hypertension: Secondary | ICD-10-CM | POA: Diagnosis present

## 2018-09-02 DIAGNOSIS — F909 Attention-deficit hyperactivity disorder, unspecified type: Secondary | ICD-10-CM | POA: Diagnosis present

## 2018-09-02 DIAGNOSIS — Z87891 Personal history of nicotine dependence: Secondary | ICD-10-CM

## 2018-09-02 DIAGNOSIS — E139 Other specified diabetes mellitus without complications: Secondary | ICD-10-CM

## 2018-09-02 DIAGNOSIS — I63512 Cerebral infarction due to unspecified occlusion or stenosis of left middle cerebral artery: Secondary | ICD-10-CM | POA: Diagnosis not present

## 2018-09-02 DIAGNOSIS — F419 Anxiety disorder, unspecified: Secondary | ICD-10-CM | POA: Diagnosis present

## 2018-09-02 DIAGNOSIS — E785 Hyperlipidemia, unspecified: Secondary | ICD-10-CM | POA: Diagnosis present

## 2018-09-02 DIAGNOSIS — E1136 Type 2 diabetes mellitus with diabetic cataract: Secondary | ICD-10-CM | POA: Diagnosis present

## 2018-09-02 DIAGNOSIS — E1140 Type 2 diabetes mellitus with diabetic neuropathy, unspecified: Secondary | ICD-10-CM | POA: Diagnosis present

## 2018-09-02 DIAGNOSIS — Z7984 Long term (current) use of oral hypoglycemic drugs: Secondary | ICD-10-CM

## 2018-09-02 DIAGNOSIS — Z79899 Other long term (current) drug therapy: Secondary | ICD-10-CM

## 2018-09-02 DIAGNOSIS — R29700 NIHSS score 0: Secondary | ICD-10-CM | POA: Diagnosis present

## 2018-09-02 DIAGNOSIS — I639 Cerebral infarction, unspecified: Secondary | ICD-10-CM | POA: Diagnosis present

## 2018-09-02 DIAGNOSIS — E1165 Type 2 diabetes mellitus with hyperglycemia: Secondary | ICD-10-CM | POA: Diagnosis present

## 2018-09-02 DIAGNOSIS — K219 Gastro-esophageal reflux disease without esophagitis: Secondary | ICD-10-CM | POA: Diagnosis present

## 2018-09-02 LAB — CBC WITH DIFFERENTIAL/PLATELET
Abs Immature Granulocytes: 0.06 10*3/uL (ref 0.00–0.07)
BASOS PCT: 1 %
Basophils Absolute: 0.1 10*3/uL (ref 0.0–0.1)
Eosinophils Absolute: 0.1 10*3/uL (ref 0.0–0.5)
Eosinophils Relative: 2 %
HCT: 50.1 % — ABNORMAL HIGH (ref 36.0–46.0)
Hemoglobin: 16.3 g/dL — ABNORMAL HIGH (ref 12.0–15.0)
Immature Granulocytes: 1 %
Lymphocytes Relative: 40 %
Lymphs Abs: 3 10*3/uL (ref 0.7–4.0)
MCH: 29.4 pg (ref 26.0–34.0)
MCHC: 32.5 g/dL (ref 30.0–36.0)
MCV: 90.4 fL (ref 80.0–100.0)
MONOS PCT: 6 %
Monocytes Absolute: 0.5 10*3/uL (ref 0.1–1.0)
NEUTROS ABS: 3.9 10*3/uL (ref 1.7–7.7)
Neutrophils Relative %: 50 %
PLATELETS: 312 10*3/uL (ref 150–400)
RBC: 5.54 MIL/uL — ABNORMAL HIGH (ref 3.87–5.11)
RDW: 11.9 % (ref 11.5–15.5)
WBC: 7.6 10*3/uL (ref 4.0–10.5)
nRBC: 0 % (ref 0.0–0.2)

## 2018-09-02 LAB — COMPREHENSIVE METABOLIC PANEL
ALBUMIN: 3.3 g/dL — AB (ref 3.5–5.0)
ALT: 15 U/L (ref 0–44)
ANION GAP: 8 (ref 5–15)
AST: 16 U/L (ref 15–41)
Alkaline Phosphatase: 72 U/L (ref 38–126)
BUN: 6 mg/dL — ABNORMAL LOW (ref 8–23)
CHLORIDE: 102 mmol/L (ref 98–111)
CO2: 26 mmol/L (ref 22–32)
Calcium: 9.2 mg/dL (ref 8.9–10.3)
Creatinine, Ser: 0.78 mg/dL (ref 0.44–1.00)
GFR calc Af Amer: >60 mL/min (ref >60)
GFR calc non Af Amer: >60 mL/min (ref >60)
GLUCOSE: 402 mg/dL — AB (ref 70–99)
Potassium: 4.4 mmol/L (ref 3.5–5.1)
Sodium: 136 mmol/L (ref 135–145)
TOTAL PROTEIN: 7.3 g/dL (ref 6.5–8.1)
Total Bilirubin: 0.6 mg/dL (ref 0.3–1.2)

## 2018-09-02 LAB — URINALYSIS, ROUTINE W REFLEX MICROSCOPIC
Bilirubin Urine: NEGATIVE
Glucose, UA: >=500 mg/dL — AB
Hgb urine dipstick: NEGATIVE
Ketones, ur: NEGATIVE mg/dL
Nitrite: NEGATIVE
Protein, ur: NEGATIVE mg/dL
Specific Gravity, Urine: 1.028 (ref 1.005–1.030)
pH: 5 (ref 5.0–8.0)

## 2018-09-02 NOTE — ED Triage Notes (Signed)
Pt here for evaluation of dizziness and weakness to the right arm and leg that started yesterday after work. Pt report neuropathy to the left leg but the right leg and arm are feeling different now.  Problem with walking yesterday after work which now resolved.  VAN negative.  No deficits at this time.

## 2018-09-02 NOTE — ED Provider Notes (Signed)
Patient placed in Quick Look pathway, seen and evaluated   Chief Complaint: Weakness  HPI:   Wendy Underwood is a 67 y.o. female who presents to the ED today for feeling "wobbly" since yesterday. Feels like her right leg has been weak and has had some tingling / weakness to her right wrist. No numbness. No problems with left upper or lower extremity. Denies hx of similar.   ROS: + weakness, - numbness, headache    Physical Exam:   Gen: No distress  Neuro: Awake and Alert  Skin: Warm    Focused Exam: All four extremities with full ROM and 5/5 strength. CN 2-12 grossly intact. No CVA tenderness or midline C/T/L spine tenderness.   Initiation of care has begun. The patient has been counseled on the process, plan, and necessity for staying for the completion/evaluation, and the remainder of the medical screening examination    Ward, Ozella Almond, PA-C 09/02/18 [place], MD 09/02/18 [id number]

## 2018-09-02 NOTE — ED Provider Notes (Signed)
[facility] EMERGENC[facility] DEPARTMENT Provider Note   CSN: [id number] Arrival date & time: 09/02/18  1658     History   Chief Complaint Chief Complaint  Patient presents with  . Dizziness    HPI Wendy Underwood is a 67 y.o. female.  Patient is a 67 year old female with a history of diabetes, neuropathy, anxiety, hyperlipidemia who is presenting today with the sensation of being off balance.  She states she has had a stressful week she recently moved after living for 4 months in a women's shelter and had a lot of things going on but seem to be fine until Wednesday night.  She was getting done with work and going back to her home when she was walking out to her car she felt off balance.  She states she just feels wobbly when she walks.  She denies feeling dizziness, spinning or any vertiginous sensation.  She also had a strange sensation in her right arm and leg.  She states since that time her symptoms have persisted and are present every time she attempts to get up to walk.  She otherwise feels normal she is lying down.  She has been sleeping a lot the last few days but felt like it was just related to being tired from her move.  She has not had fever, cough, headache, visual changes, abdominal pain, nausea or vomiting.  She has been eating regularly.  She denies any urinary symptoms.  The history is provided by the patient.  Dizziness  Quality:  Imbalance Severity:  Moderate Onset quality:  Sudden Duration:  2 days Timing:  Constant Progression:  Unchanged Chronicity:  New Context comment:  Started wednesday night after work she went to go to her car and was off balance and it has never gone away.  also mild sensation in the right arm and leg Relieved by:  Being still Exacerbated by: walking. Associated symptoms: no chest pain, no diarrhea, no headaches, no nausea, no palpitations, no shortness of breath, no syncope, no vision changes, no vomiting and no weakness     Associated symptoms comment:  Sleeping a lot but just thought she was tired Risk factors: no hx of vertigo   Risk factors comment:  Hx of diabetes but metformin made her sick and hasn't been on meds in >4years   Past Medical History:  Diagnosis Date  . ADD (attention deficit disorder)   . ADHD (attention deficit hyperactivity disorder)   . Anxiety   . Diabetes mellitus   . GERD (gastroesophageal reflux disease)   . Hyperlipidemia   . Menopause     Patient Active Problem List   Diagnosis Date Noted  . Onychomycosis 07/09/2015  . Neuropathy 06/06/2015  . 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 Personal history of DVT (deep vein thrombosis) 07/04/2012  . Anxiety 06/01/2012  . GERD (gastroesophageal reflux disease) 06/01/2012  . Other and unspecified hyperlipidemia 06/01/2012  . Menopause 06/01/2012  . Varicose veins 06/01/2012  . DJD (degenerative joint disease) of knee 06/01/2012    Past Surgical History:  Procedure Laterality Date  . FOOT SURGERY  2012  . [place]  . HAMMERTOE RECONSTRUCTION WITH WEIL OSTEOTOMY  10/27/2012   Procedure: HAMMERTOE RECONSTRUCTION WITH WEIL OSTEOTOMY;  Surgeon: [name], MD;  Location: [facility];  Service: Orthopedics;  Laterality: Right;  Right Second, Third and Fouth  Hammertoe Correction; Right Second and Third Metatarsal Weil Osteotomy; Second and Third Dorsal Capsulotomy;  Second Solicitor  . KNEE ARTHROSCOPY  2008 and 2009   both knees  . MOUTH SURGERY  2010  . varicose veins  2011and 1991  . WRIST SURGERY     cyst removal     OB History    Gravida  3   Para  3   Term  3   Preterm      AB      Living  2     SAB      TAB      Ectopic      Multiple      Live Births               Home Medications    Prior to Admission medications   Medication Sig Start Date End Date Taking? Authorizing Provider  Blood Glucose Monitoring Suppl (TRUE METRIX METER)  W/DEVICE KIT Use as directed! 05/10/15   Lance Bosch, NP  furosemide (LASIX) 20 MG tablet One tablet po for 2 days, then 1/2 tablet q d prn leg edema. 05/30/15   Janne Napoleon, NP  gabapentin (NEURONTIN) 300 MG capsule Take 1 capsule (300 mg total) by mouth 3 (three) times daily. 07/01/15   Lance Bosch, NP  glipiZIDE (GLUCOTROL) 5 MG tablet Take 1 tablet (5 mg total) by mouth 2 (two) times daily before a meal. 05/10/15   Lance Bosch, NP  glucose blood (ACCU-CHEK AVIVA) test strip Use as instructed 05/10/15   Chari Manning A, NP  glucose blood (TRUE METRIX BLOOD GLUCOSE TEST) test strip 1 each by Other route 3 (three) times daily. Use as instructed 05/10/15   Chari Manning A, NP  lisinopril (ZESTRIL) 2.5 MG tablet Take 1 tablet (2.5 mg total) by mouth daily. 05/10/15   Lance Bosch, NP  Multiple Vitamin (MULTIVITAMIN) tablet Take 1 tablet by mouth daily.    [provider]  naproxen (NAPROSYN) 500 MG tablet Take 1 tablet (500 mg total) by mouth 2 (two) times daily. 05/21/17   Dorie Rank, MD  naproxen sodium (ANAPROX) 220 MG tablet Take 440 mg by mouth 2 (two) times daily as needed (pain).    [provider]  omeprazole (PRILOSEC) 20 MG capsule Take 1 capsule (20 mg total) by mouth daily before breakfast. 05/10/15   Lance Bosch, NP  potassium chloride (K-DUR) 10 MEQ tablet Take 2 tablets per day for 3 days, then 1 q d while taking Lasix 05/30/15   Janne Napoleon, NP  pseudoephedrine (SUDAFED 12 HOUR) 120 MG 12 hr tablet Take 1 tablet (120 mg total) by mouth 2 (two) times daily. 05/21/17   Dorie Rank, MD  terbinafine (LAMISIL) 250 MG tablet Take 1 tablet (250 mg total) by mouth daily. 07/09/15   Lance Bosch, NP  TRUEPLUS LANCETS 28G MISC Use TID 05/10/15   Lance Bosch, NP  Vitamin D, Ergocalciferol, (DRISDOL) 50000 UNITS CAPS capsule Take 1 capsule (50,000 Units total) by mouth every 7 (seven) days. Patient not taking: Reported on 06/06/2015 06/05/15   Tresa Garter, MD    zolpidem (AMBIEN) 10 MG tablet Take 10 mg by mouth at bedtime.  04/14/12   [provider]  metFORMIN (GLUMETZA) 500 MG (MOD) 24 hr tablet Take 1 tablet (500 mg total) by mouth daily with breakfast. 08/16/12 04/10/15  Schoenhoff, Altamese Cabal, MD    Family History Family History  Problem Relation Age of Onset  . Heart attack Maternal Grandfather   . Hypertension Maternal Grandfather   .  Heart attack Father   . Cancer Mother        skin  . Schizophrenia Mother   . Depression Mother   . Diabetes Mother   . Hypertension Mother   . Cancer Maternal Uncle        skin  . Emphysema Maternal Aunt   . ADD / ADHD Son     Social History Social History   Tobacco Use  . Smoking status: Former Smoker    Packs/day: 10.50    Years: 30.00    Pack years: 315.00    Types: Cigarettes    Last attempt to quit: 06/02/2007    Years since quitting: 11.2  . Smokeless tobacco: Never Used  Substance Use Topics  . Alcohol use: No  . Drug use: No     Allergies   Atorvastatin   Review of Systems Review of Systems  Respiratory: Negative for shortness of breath.   Cardiovascular: Negative for chest pain, palpitations and syncope.  Gastrointestinal: Negative for diarrhea, nausea and vomiting.  Neurological: Positive for dizziness. Negative for weakness and headaches.  All other systems reviewed and are negative.    Physical Exam Updated Vital Signs BP (!) 157/75   Pulse 88   Temp 98.2 F (36.8 C) (Oral)   Resp 17   SpO2 100%   Physical Exam  Constitutional: She is oriented to person, place, and time. She appears well-developed and well-nourished. No distress.  HENT:  Head: Normocephalic and atraumatic.  Mouth/Throat: Oropharynx is clear and moist.  Eyes: Pupils are equal, round, and reactive to light. Conjunctivae and EOM are normal.  Neck: Normal range of motion. Neck supple.  Cardiovascular: Normal rate, regular rhythm and intact distal pulses.  No murmur  heard. Pulmonary/Chest: Effort normal and breath sounds normal. No respiratory distress. She has no wheezes. She has no rales.  Abdominal: Soft. She exhibits no distension. There is no tenderness. There is no rebound and no guarding.  Musculoskeletal: Normal range of motion. She exhibits no edema or tenderness.  Neurological: She is alert and oriented to person, place, and time. She has normal strength. No cranial nerve deficit or sensory deficit.  No pronator drift noted in all 4 extremities.  Heel-to-shin bilaterally without acute findings.  Finger-to-nose within normal limits.  No visual field cuts.  Speech is normal.  Skin: Skin is warm and dry. No rash noted. No erythema.  Psychiatric: She has a normal mood and affect. Her behavior is normal.  Nursing note and vitals reviewed.    ED Treatments / Results  Labs (all labs ordered are listed, but only abnormal results are displayed) Labs Reviewed  COMPREHENSIVE METABOLIC PANEL - Abnormal; Notable for the following components:      Result Value   Glucose, Bld 402 (*)    BUN 6 (*)    Albumin 3.3 (*)    All other components within normal limits  CBC WITH DIFFERENTIAL/PLATELET - Abnormal; Notable for the following components:   RBC 5.54 (*)    Hemoglobin 16.3 (*)    HCT 50.1 (*)    All other components within normal limits  URINALYSIS, ROUTINE W REFLEX MICROSCOPIC - Abnormal; Notable for the following components:   Glucose, UA >=500 (*)    Leukocytes, UA SMALL (*)    Bacteria, UA RARE (*)    All other components within normal limits    EKG None   ED ECG REPORT   Date: 09/02/2018  Rate: 73  Rhythm: normal sinus rhythm  QRS Axis:  normal  Intervals: normal  ST/T Wave abnormalities: nonspecific ST changes  Conduction Disutrbances:none  Narrative Interpretation:   Old EKG Reviewed: none available  I have personally reviewed the EKG tracing and agree with the computerized printout as noted.   Radiology No results  found.  Procedures Procedures (including critical care time)  Medications Ordered in ED Medications - No data to display   Initial Impression / Assessment and Plan / ED Course  I have reviewed the triage vital signs and the nursing notes.  Pertinent labs & imaging results that were available during my care of the patient were reviewed by me and considered in my medical decision making (see chart for details).     Patient presenting today with being off balance.  She does not describe specific vertigo but has had ongoing issues when attempting to walk.  She also complains of a vague sensation of tingling in her right arm and leg.  She has no focal neurologic findings on exam however with her history of untreated diabetes and hyperlipidemia concern for possible stroke.  She would not be in the window because symptoms started on Wednesday night which is greater than 24 hours ago.  Labs show a hyperglycemia of 400.other acute findings.  We will do an MRI to evaluate for stroke.  Offered patient IV fluids for her blood sugar which she declined stating she does not want to be stuck again.  11:33 PM Pt checked out to Dr. Betsey Holiday  Final Clinical Impressions(s) / ED Diagnoses   Final diagnoses:  None    ED Discharge Orders    None       Blanchie Dessert, MD 09/02/18 2333

## 2018-09-03 ENCOUNTER — Inpatient Hospital Stay (HOSPITAL_COMMUNITY): Payer: PRIVATE HEALTH INSURANCE

## 2018-09-03 ENCOUNTER — Emergency Department (HOSPITAL_COMMUNITY): Payer: PRIVATE HEALTH INSURANCE

## 2018-09-03 DIAGNOSIS — I63512 Cerebral infarction due to unspecified occlusion or stenosis of left middle cerebral artery: Secondary | ICD-10-CM | POA: Diagnosis present

## 2018-09-03 DIAGNOSIS — R29700 NIHSS score 0: Secondary | ICD-10-CM | POA: Diagnosis present

## 2018-09-03 DIAGNOSIS — E1165 Type 2 diabetes mellitus with hyperglycemia: Secondary | ICD-10-CM

## 2018-09-03 DIAGNOSIS — Z79899 Other long term (current) drug therapy: Secondary | ICD-10-CM | POA: Diagnosis not present

## 2018-09-03 DIAGNOSIS — E785 Hyperlipidemia, unspecified: Secondary | ICD-10-CM

## 2018-09-03 DIAGNOSIS — E1140 Type 2 diabetes mellitus with diabetic neuropathy, unspecified: Secondary | ICD-10-CM | POA: Diagnosis present

## 2018-09-03 DIAGNOSIS — E1169 Type 2 diabetes mellitus with other specified complication: Secondary | ICD-10-CM | POA: Diagnosis not present

## 2018-09-03 DIAGNOSIS — Z7984 Long term (current) use of oral hypoglycemic drugs: Secondary | ICD-10-CM | POA: Diagnosis not present

## 2018-09-03 DIAGNOSIS — I10 Essential (primary) hypertension: Secondary | ICD-10-CM | POA: Diagnosis present

## 2018-09-03 DIAGNOSIS — I5030 Unspecified diastolic (congestive) heart failure: Secondary | ICD-10-CM

## 2018-09-03 DIAGNOSIS — I639 Cerebral infarction, unspecified: Secondary | ICD-10-CM | POA: Diagnosis not present

## 2018-09-03 DIAGNOSIS — I69398 Other sequelae of cerebral infarction: Secondary | ICD-10-CM | POA: Diagnosis not present

## 2018-09-03 DIAGNOSIS — I63312 Cerebral infarction due to thrombosis of left middle cerebral artery: Secondary | ICD-10-CM | POA: Diagnosis not present

## 2018-09-03 DIAGNOSIS — F419 Anxiety disorder, unspecified: Secondary | ICD-10-CM | POA: Diagnosis present

## 2018-09-03 DIAGNOSIS — I63412 Cerebral infarction due to embolism of left middle cerebral artery: Secondary | ICD-10-CM

## 2018-09-03 DIAGNOSIS — Z888 Allergy status to other drugs, medicaments and biological substances status: Secondary | ICD-10-CM | POA: Diagnosis not present

## 2018-09-03 DIAGNOSIS — E139 Other specified diabetes mellitus without complications: Secondary | ICD-10-CM | POA: Diagnosis not present

## 2018-09-03 DIAGNOSIS — K219 Gastro-esophageal reflux disease without esophagitis: Secondary | ICD-10-CM | POA: Diagnosis present

## 2018-09-03 DIAGNOSIS — F909 Attention-deficit hyperactivity disorder, unspecified type: Secondary | ICD-10-CM | POA: Diagnosis present

## 2018-09-03 DIAGNOSIS — Z87891 Personal history of nicotine dependence: Secondary | ICD-10-CM | POA: Diagnosis not present

## 2018-09-03 DIAGNOSIS — R269 Unspecified abnormalities of gait and mobility: Secondary | ICD-10-CM | POA: Diagnosis not present

## 2018-09-03 DIAGNOSIS — I4891 Unspecified atrial fibrillation: Secondary | ICD-10-CM | POA: Diagnosis not present

## 2018-09-03 DIAGNOSIS — E1136 Type 2 diabetes mellitus with diabetic cataract: Secondary | ICD-10-CM | POA: Diagnosis present

## 2018-09-03 LAB — GLUCOSE, CAPILLARY
GLUCOSE-CAPILLARY: 137 mg/dL — AB (ref 70–99)
Glucose-Capillary: 283 mg/dL — ABNORMAL HIGH (ref 70–99)
Glucose-Capillary: 313 mg/dL — ABNORMAL HIGH (ref 70–99)
Glucose-Capillary: 279 mg/dL — ABNORMAL HIGH (ref 70–99)

## 2018-09-03 LAB — ECHOCARDIOGRAM COMPLETE
Height: 65 in
Weight: 2352.75 oz

## 2018-09-03 LAB — RAPID URINE DRUG SCREEN, HOSP PERFORMED
Amphetamines: NOT DETECTED
BARBITURATES: NOT DETECTED
Benzodiazepines: NOT DETECTED
Cocaine: NOT DETECTED
Opiates: NOT DETECTED
TETRAHYDROCANNABINOL: NOT DETECTED

## 2018-09-03 LAB — HIV ANTIBODY (ROUTINE TESTING W REFLEX): HIV Screen 4th Generation wRfx: NONREACTIVE

## 2018-09-03 LAB — CBG MONITORING, ED: Glucose-Capillary: 248 mg/dL — ABNORMAL HIGH (ref 70–99)

## 2018-09-03 MED ORDER — INSULIN ASPART 100 UNIT/ML ~~LOC~~ SOLN: 0.0000 [IU] | Freq: Every day | SUBCUTANEOUS | Status: DC

## 2018-09-03 MED ORDER — STROKE: EARLY STAGES OF RECOVERY BOOK
Freq: Once | Status: DC
  Filled 2018-09-03: qty 1

## 2018-09-03 MED ORDER — GLIPIZIDE 5 MG PO TABS
5.0000 mg | ORAL_TABLET | Freq: Two times a day (BID) | ORAL | Status: DC
  Administered 2018-09-03 – 2018-09-05 (×4): 5 mg via ORAL
  Filled 2018-09-03 (×4): qty 1

## 2018-09-03 MED ORDER — ACETAMINOPHEN 325 MG PO TABS
650.0000 mg | ORAL_TABLET | ORAL | Status: DC | PRN
  Administered 2018-09-05 (×2): 650 mg via ORAL
  Filled 2018-09-03 (×2): qty 2

## 2018-09-03 MED ORDER — ACETAMINOPHEN 160 MG/5ML PO SOLN: 650.0000 mg | ORAL | Status: DC | PRN

## 2018-09-03 MED ORDER — ASPIRIN EC 81 MG PO TBEC
81.0000 mg | DELAYED_RELEASE_TABLET | Freq: Every day | ORAL | Status: DC
  Administered 2018-09-03 – 2018-09-05 (×3): 81 mg via ORAL
  Filled 2018-09-03 (×3): qty 1

## 2018-09-03 MED ORDER — INSULIN ASPART 100 UNIT/ML ~~LOC~~ SOLN: 0.0000 [IU] | Freq: Three times a day (TID) | SUBCUTANEOUS | Status: DC

## 2018-09-03 MED ORDER — CLOPIDOGREL BISULFATE 75 MG PO TABS
300.0000 mg | ORAL_TABLET | Freq: Once | ORAL | Status: AC
  Administered 2018-09-03: 300 mg via ORAL
  Filled 2018-09-03: qty 4

## 2018-09-03 MED ORDER — CLOPIDOGREL BISULFATE 75 MG PO TABS
75.0000 mg | ORAL_TABLET | Freq: Every day | ORAL | Status: DC
  Administered 2018-09-04 – 2018-09-05 (×2): 75 mg via ORAL
  Filled 2018-09-03 (×2): qty 1

## 2018-09-03 MED ORDER — ENOXAPARIN SODIUM 40 MG/0.4ML ~~LOC~~ SOLN
40.0000 mg | SUBCUTANEOUS | Status: DC
  Filled 2018-09-03 (×3): qty 0.4

## 2018-09-03 MED ORDER — IOPAMIDOL (ISOVUE-370) INJECTION 76%
INTRAVENOUS | Status: AC
  Filled 2018-09-03: qty 50

## 2018-09-03 MED ORDER — ASPIRIN 325 MG PO TABS: 325.0000 mg | ORAL_TABLET | Freq: Every day | ORAL | Status: DC

## 2018-09-03 MED ORDER — ACETAMINOPHEN 650 MG RE SUPP: 650.0000 mg | RECTAL | Status: DC | PRN

## 2018-09-03 MED ORDER — IOPAMIDOL (ISOVUE-370) INJECTION 76%
50.0000 mL | Freq: Once | INTRAVENOUS | Status: AC | PRN
  Administered 2018-09-03: 50 mL via INTRAVENOUS

## 2018-09-03 MED ORDER — INSULIN GLARGINE 100 UNIT/ML ~~LOC~~ SOLN
10.0000 [IU] | Freq: Every day | SUBCUTANEOUS | Status: DC
  Filled 2018-09-03 (×2): qty 0.1

## 2018-09-03 MED ORDER — SODIUM CHLORIDE 0.9 % IV SOLN
INTRAVENOUS | Status: DC
  Administered 2018-09-03: 06:00:00 via INTRAVENOUS

## 2018-09-03 NOTE — H&P (Signed)
History and Physical    OKLA QAZI XBW:[id number] DOB: February 18, 1951 DOA: 09/02/2018  Referring MD/NP/PA: Joseph Berkshire PCP: Patient, No Pcp Per  Patient coming from: Home  Chief Complaint: Wobbly  I have personally briefly reviewed patient's old medical records in [place]   HPI: Wendy Underwood is a 67 y.o. female with medical history significant of DM type 2, HLD, and anxiety; who presents with complaints of feeling off balance and wobbly.  Symptoms initially started in the evening on 10/9, while walking leaving work.  She reports feeling wobbly when ambulating.  Complains of possible mild weakness/ strange feeling on her right arm and leg.  Denies having any dizziness, facial droop, slurred speech, chest pain, fever, chills, nausea, vomiting, diarrhea, urinary frequency, or dysuria.  Other associated symptoms include intermittent complaints of palpitations and feels that she needs dehydrated.  At baseline patient complains of intermittent blurred vision, but relates it to cataracts.  Patient has diabetes, but she does not regularly check her sugars, take any medications for treatment, or routinely see a doctor.  She previously had been on metformin, but reports that she stopped taking it because it caused her to have diarrhea and she works in a call center.  She has a previous history of smoking tobacco, but quit 11 years ago.  ED Course: Upon admission to the emergency department patient was noted to be afebrile with vital signs relatively within normal limits.  Labs revealed hemoglobin 16.3, glucose 402, anion gap 11.  MRI of the brain revealed multiple small ischemic infarcts in the left hemisphere.  Neurology was consulted, but patient was out of the window for any acute intervention.  They recommended completion of stroke work-up and to obtain blood cultures.  TR[facility] called to admit.  Review of Systems  Constitutional: Negative for chills and fever.  HENT: Negative for  congestion and nosebleeds.   Eyes: Positive for blurred vision. Negative for pain.  Respiratory: Negative for cough and shortness of breath.   Cardiovascular: Positive for palpitations. Negative for chest pain.  Gastrointestinal: Negative for abdominal pain, nausea and vomiting.  Genitourinary: Negative for dysuria and frequency.  Musculoskeletal: Negative for falls and neck pain.  Skin: Negative for itching and rash.  Neurological: Positive for sensory change and focal weakness. Negative for loss of consciousness.  Endo/Heme/Allergies: Negative for polydipsia.  Psychiatric/Behavioral: Negative for memory loss and substance abuse.    Past Medical History:  Diagnosis Date  . ADD (attention deficit disorder)   . ADHD (attention deficit hyperactivity disorder)   . Anxiety   . Diabetes mellitus   . GERD (gastroesophageal reflux disease)   . Hyperlipidemia   . Menopause     Past Surgical History:  Procedure Laterality Date  . FOOT SURGERY  2012  . [place]  . HAMMERTOE RECONSTRUCTION WITH WEIL OSTEOTOMY  10/27/2012   Procedure: HAMMERTOE RECONSTRUCTION WITH WEIL OSTEOTOMY;  Surgeon: [name], MD;  Location: [facility];  Service: Orthopedics;  Laterality: Right;  Right Second, Third and Fouth  Hammertoe Correction; Right Second and Third Metatarsal Weil Osteotomy; Second and Third Dorsal Capsulotomy;  Second Solicitor  . KNEE ARTHROSCOPY  2008 and 2009   both knees  . MOUTH SURGERY  2010  . varicose veins  2011and 1991  . WRIST SURGERY     cyst removal     reports that she quit smoking about 11 years ago. Her smoking use included cigarettes. She has a 315.00 pack-year smoking  history. She has never used smokeless tobacco. She reports that she does not drink alcohol or use drugs.  Allergies  Allergen Reactions  . Atorvastatin Other (See Comments)    Muscles hurt  . Statins Other (See Comments)    Muscle aches    Family History    Problem Relation Age of Onset  . Heart attack Maternal Grandfather   . Hypertension Maternal Grandfather   . Heart attack Father   . Cancer Mother        skin  . Schizophrenia Mother   . Depression Mother   . Diabetes Mother   . Hypertension Mother   . Cancer Maternal Uncle        skin  . Emphysema Maternal Aunt   . ADD / ADHD Son     Prior to Admission medications   Medication Sig Start Date End Date Taking? Authorizing Provider  Cholecalciferol (VITAMIN D-3) 1000 units CAPS Take 2,000 Units by mouth daily.   Yes [provider]  diphenhydrAMINE HCl, Sleep, (ZZZQUIL) 25 MG CAPS Take 50 mg by mouth at bedtime as needed (for sleep).   Yes [provider]  MAGNESIUM PO Take 2 tablets by mouth daily.   Yes [provider]  Multiple Vitamin (MULTIVITAMIN) tablet Take 1 tablet by mouth daily.   Yes [provider]  Naproxen Sod-diphenhydrAMINE (ALEVE PM) 220-25 MG TABS Take 1-2 tablets by mouth at bedtime as needed (for nighttime pain in the legs and/or hands).   Yes [provider]  POTASSIUM PO Take 1 tablet by mouth daily.   Yes [provider]  Prenatal Vit-Fe Fumarate-FA (PRENATAL COMPLETE PO) Take 1 tablet by mouth daily.   Yes [provider]  Blood Glucose Monitoring Suppl (TRUE METRIX METER) W/DEVICE KIT Use as directed! 05/10/15   Lance Bosch, NP  furosemide (LASIX) 20 MG tablet One tablet po for 2 days, then 1/2 tablet q d prn leg edema. Patient not taking: Reported on 09/02/2018 05/30/15   Janne Napoleon, NP  gabapentin (NEURONTIN) 300 MG capsule Take 1 capsule (300 mg total) by mouth 3 (three) times daily. Patient not taking: Reported on 09/02/2018 07/01/15   Lance Bosch, NP  glipiZIDE (GLUCOTROL) 5 MG tablet Take 1 tablet (5 mg total) by mouth 2 (two) times daily before a meal. Patient not taking: Reported on 09/02/2018 05/10/15   Lance Bosch, NP  glucose blood (ACCU-CHEK AVIVA) test strip Use as instructed  05/10/15   Chari Manning A, NP  glucose blood (TRUE METRIX BLOOD GLUCOSE TEST) test strip 1 each by Other route 3 (three) times daily. Use as instructed 05/10/15   Chari Manning A, NP  lisinopril (ZESTRIL) 2.5 MG tablet Take 1 tablet (2.5 mg total) by mouth daily. Patient not taking: Reported on 09/02/2018 05/10/15   Lance Bosch, NP  naproxen (NAPROSYN) 500 MG tablet Take 1 tablet (500 mg total) by mouth 2 (two) times daily. Patient not taking: Reported on 09/02/2018 05/21/17   Dorie Rank, MD  omeprazole (PRILOSEC) 20 MG capsule Take 1 capsule (20 mg total) by mouth daily before breakfast. Patient not taking: Reported on 09/02/2018 05/10/15   Lance Bosch, NP  potassium chloride (K-DUR) 10 MEQ tablet Take 2 tablets per day for 3 days, then 1 q d while taking Lasix Patient not taking: Reported on 09/02/2018 05/30/15   Janne Napoleon, NP  pseudoephedrine (SUDAFED 12 HOUR) 120 MG 12 hr tablet Take 1 tablet (120 mg total) by mouth 2 (two)  times daily. Patient not taking: Reported on 09/02/2018 05/21/17   Dorie Rank, MD  terbinafine (LAMISIL) 250 MG tablet Take 1 tablet (250 mg total) by mouth daily. Patient not taking: Reported on 09/02/2018 07/09/15   Lance Bosch, NP  TRUEPLUS LANCETS 28G MISC Use TID 05/10/15   Lance Bosch, NP  Vitamin D, Ergocalciferol, (DRISDOL) 50000 UNITS CAPS capsule Take 1 capsule (50,000 Units total) by mouth every 7 (seven) days. Patient not taking: Reported on 09/02/2018 06/05/15   Tresa Garter, MD  metFORMIN (GLUMETZA) 500 MG (MOD) 24 hr tablet Take 1 tablet (500 mg total) by mouth daily with breakfast. 08/16/12 04/10/15  Lanice Shirts, MD    Physical Exam:  Constitutional: Elderly female in NAD, calm, comfortable Vitals:   09/02/18 2215 09/03/18 0015 09/03/18 0252 09/03/18 0300  BP: (!) 125/52 (!) 156/76 (!) 156/67 130/86  Pulse: 63 63 60 66  Resp:      Temp:      TempSrc:      SpO2: 97% 96% 100% 96%   Eyes: PERRL, lids and conjunctivae  normal ENMT: Mucous membranes are moist. Posterior pharynx clear of any exudate or lesions.Normal dentition.  Neck: normal, supple, no masses, no thyromegaly Respiratory: clear to auscultation bilaterally, no wheezing, no crackles. Normal respiratory effort. No accessory muscle use.  Cardiovascular: Regular rate and rhythm, no murmurs / rubs / gallops. No extremity edema. 2+ pedal pulses. No carotid bruits.  Abdomen: no tenderness, no masses palpated. No hepatosplenomegaly. Bowel sounds positive.  Musculoskeletal: no clubbing / cyanosis. No joint deformity upper and lower extremities. Good ROM, no contractures. Normal muscle tone.  Skin: no rashes, lesions, ulcers. No induration Neurologic: CN 2-12 grossly intact. Sensation intact, DTR normal. Strength 5/5 on patient's left side and 4+/5 on right.  Mild drift noted on the right lower extremity. Psychiatric: Normal judgment and insight. Alert and oriented x 3. Normal mood.     Labs on Admission: I have personally reviewed following labs and imaging studies  CBC: Recent Labs  Lab 09/02/18 1739  WBC 7.6  NEUTROABS 3.9  HGB 16.3*  HCT 50.1*  MCV 90.4  PLT 564   Basic Metabolic Panel: Recent Labs  Lab 09/02/18 1739  NA 136  K 4.4  CL 102  CO2 26  GLUCOSE 402*  BUN 6*  CREATININE 0.78  CALCIUM 9.2   GFR: CrCl cannot be calculated (Unknown ideal weight.). Liver Function Tests: Recent Labs  Lab 09/02/18 1739  AST 16  ALT 15  ALKPHOS 72  BILITOT 0.6  PROT 7.3  ALBUMIN 3.3*   No results for input(s): LIPASE, AMYLASE in the last 168 hours. No results for input(s): AMMONIA in the last 168 hours. Coagulation Profile: No results for input(s): INR, PROTIME in the last 168 hours. Cardiac Enzymes: No results for input(s): CKTOTAL, CKMB, CKMBINDEX, TROPONINI in the last 168 hours. BNP (last 3 results) No results for input(s): PROBNP in the last 8760 hours. HbA1C: No results for input(s): HGBA1C in the last 72  hours. CBG: Recent Labs  Lab 09/03/18 0227  GLUCAP 248*   Lipid Profile: No results for input(s): CHOL, HDL, LDLCALC, TRIG, CHOLHDL, LDLDIRECT in the last 72 hours. Thyroid Function Tests: No results for input(s): TSH, T4TOTAL, FREET4, T3FREE, THYROIDAB in the last 72 hours. Anemia Panel: No results for input(s): VITAMINB12, FOLATE, FERRITIN, TIBC, IRON, RETICCTPCT in the last 72 hours. Urine analysis:    Component Value Date/Time   COLORURINE YELLOW 09/02/2018 [place]  09/02/2018 1948   LABSPEC 1.028 09/02/2018 1948   PHURINE 5.0 09/02/2018 1948   GLUCOSEU >=500 (A) 09/02/2018 1948   HGBUR NEGATIVE 09/02/2018 1948   BILIRUBINUR NEGATIVE 09/02/2018 [place] NEGATIVE 09/02/2018 1948   PROTEINUR NEGATIVE 09/02/2018 1948   UROBILINOGEN 1.0 05/27/2015 0023   NITRITE NEGATIVE 09/02/2018 1948   LEUKOCYTESUR SMALL (A) 09/02/2018 1948   Sepsis Labs: No results found for this or any previous visit (from the past 240 hour(s)).   Radiological Exams on Admission: Mr Brain Wo Contrast  Result Date: 09/03/2018 CLINICAL DATA:  Balance problems EXAM: MRI HEAD WITHOUT CONTRAST TECHNIQUE: Multiplanar, multiecho pulse sequences of the brain and surrounding structures were obtained without intravenous contrast. COMPARISON:  None. FINDINGS: BRAIN: There are multiple foci of abnormal diffusion restriction within the left frontal and parietal lobes. The largest measures approximately 8 mm and is located in the corona radiata. There are multiple punctate subcortical foci that measure up to a 3-4 mm. The midline structures are normal. No midline shift or other mass effect. There are no old infarcts. The white matter signal is normal for the patient's age. The cerebral and cerebellar volume are age-appropriate. Susceptibility-sensitive sequences show no chronic microhemorrhage or superficial siderosis. VASCULAR: Major intracranial arterial and venous sinus flow voids are normal.  SKULL AND UPPER CERVICAL SPINE: Calvarial bone marrow signal is normal. There is no skull base mass. Visualized upper cervical spine and soft tissues are normal. SINUSES/ORBITS: No fluid levels or advanced mucosal thickening. No mastoid or middle ear effusion. The orbits are normal. IMPRESSION: 1. Multiple small ischemic infarcts the left hemisphere, the largest of which measures 8 mm and is located in the corona radiata. 2. No hemorrhage or mass effect. 3. Otherwise normal brain for age. Electronically Signed   By: Ulyses Jarred M.D.   On: 09/03/2018 01:24    EKG: Ordered  Assessment/Plan CVA: Acute.  Patient presented with complaints of difficulty with balance when ambulating.  She was found to have multiple small infarcts of the left hemisphere as documented on MRI. - Admit to telemetry bed - Stroke order set initiated - Neuro checks - Follow-up blood cultures - PT/OT/Speech to eval and treat - Check echocardiogram - Check Hemoglobin A1c and lipid panel in a.m. - ASA - Appreciate neurology consultative services, follow-up for further recommendations  Diabetes mellitus type 2 with hyperglycemia: Acute.  Patient reports not being on anything for treatment of diabetes.  Initial blood sugar 402. - Hypoglycemic protocols - CBGs q. before meals and at bedtime with sensitive sliding scale insulin - Diabetic education  Hyperlipidemia: Patient reports having allergies to statins.   DVT prophylaxis: Lovenox Code Status: Full Family Communication: No family present at bedside Disposition Plan: Likely discharge home in 2 to 3 days Consults called: Neurology Admission status: Inpatient  Norval Morton MD [facility] Pager [phone]   If 7PM-7AM, please contact night-coverage [email or web address] Password [email or web address]  09/03/2018, 3:26 AM

## 2018-09-03 NOTE — Progress Notes (Signed)
  Echocardiogram 2D Echocardiogram has been performed.  Jennette Dubin 09/03/2018, 11:54 AM

## 2018-09-03 NOTE — Progress Notes (Addendum)
PROGRESS NOTE    Wendy Underwood  BCW:[id number] DOB: 01-07-1951 DOA: 09/02/2018 PCP: Patient, No Pcp Per    Brief Narrative:  67 year old female who presented with ambulatory dysfunction.  She does have significant past medical history for type 2 diabetes mellitus, dyslipidemia and anxiety.  Reported 3 days of ambulatory dysfunction, decreased balance, no other frank focal deficits.  Blood pressure 125/52, heart rate 63, oxygen saturation 97%.  Moist mucous membranes, lungs clear to auscultation bilaterally, heart S1-S2 present and rhythmic, abdomen soft nontender, no lower extremity edema.  Left-sided weakness 4 out of 5.  Sodium 136, potassium 4.4, chloride 102, bicarb 26, glucose 402, BUN 6, creatinine 0.78, white count 7.6, hemoglobin 16.3, hematocrit 50.1 platelets 312, urinalysis greater than 500 glucose, specific gravity 1.028, 11-20 white cells, 0-5 red cells, drug screen negative.  EKG normal sinus rhythm, normal axis, normal intervals.  Brain MRI with multiple small ischemic infarcts in the left hemisphere, the largest of which measures 8 mm located in the corona radiata.  No hemorrhage or mass-effect.  Patient was admitted to the hospital with working diagnosis of left hemispheric ischemic cerebrovascular accident.    Assessment & Plan:   Active Problems:   Diabetes 1.5, managed as type 2 ([place])   Stroke (cerebrum) ([place])   1. Left hemispheric infarcts. Will continue neuro checks per unit protocol, will follow with physical therapy, speech therapy and neurology recommendations. Continue dual antiplatelet therapy with asa and clopidogrel, statin therapy and blood pressure control. Target LDL less than 70. HbA1c was 9.0 in 04/2015. Follow up on carotid US and echocardiography.   2. Dyslipidemia. Lipid profile form 05/2015 LDL 108, HDL 28, Triglycerides 150, patient had tried statins as outpatient and developed myopathy. Target LDL less than 70.   3. Uncontrolled T2DM. Glucose in  the 400's. Will continue insulin sliding scale for glucose cover and monitoring, patient did not tolerate GI symptoms from metformin. Patient is declining insulin therapy will resume glipizide.   4. HTN. Target blood pressure systolic less than 038 mmHg, will continue to hold on lisinopril for now to prevent hypotension. Systolic blood pressure 882 to 158 mmHg today.   DVT prophylaxis: enoxaparin   Code Status: full Family Communication: no family at the bedside  Disposition Plan/ discharge barriers: pending neurology recommendations    Consultants:   Neurology   Procedures:     Antimicrobials:       Subjective: Patient is feeling better, her balance is better but not yet back to baseline, no chest pain, no nausea or vomiting.   Objective: Vitals:   09/03/18 0415 09/03/18 0445 09/03/18 0517 09/03/18 0806  BP: (!) 126/56 126/60 (!) 156/59 (!) 158/72  Pulse: 65  65 70  Resp:   18 18  Temp:   98.4 F (36.9 C) 99.6 F (37.6 C)  TempSrc:   Oral Oral  SpO2: 96%  100% 99%  Weight:   66.7 kg   Height:   5\' 5"  (1.651 m)     Intake/Output Summary (Last 24 hours) at 09/03/2018 1132 Last data filed at 09/03/2018 0700 Gross per 24 hour  Intake 240 ml  Output -  Net 240 ml   Filed Weights   09/03/18 0517  Weight: 66.7 kg    Examination:   General: not in pain or dyspnea.  Neurology: Awake and alert, non focal/ balanced not assessed  E ENT: no pallor, no icterus, oral mucosa moist Cardiovascular: No JVD. S1-S2 present, rhythmic, no gallops, rubs, or murmurs. No lower  extremity edema. Pulmonary: vesicular breath sounds bilaterally, adequate air movement, no wheezing, rhonchi or rales. Gastrointestinal. Abdomen with no organomegaly, non tender, no rebound or guarding Skin. No rashes Musculoskeletal: no joint deformities     Data Reviewed: I have personally reviewed following labs and imaging studies  CBC: Recent Labs  Lab 09/02/18 1739  WBC 7.6  NEUTROABS 3.9    HGB 16.3*  HCT 50.1*  MCV 90.4  PLT 174   Basic Metabolic Panel: Recent Labs  Lab 09/02/18 1739  NA 136  K 4.4  CL 102  CO2 26  GLUCOSE 402*  BUN 6*  CREATININE 0.78  CALCIUM 9.2   GFR: Estimated Creatinine Clearance: 63.1 mL/min (by C-G formula based on SCr of 0.78 mg/dL). Liver Function Tests: Recent Labs  Lab 09/02/18 1739  AST 16  ALT 15  ALKPHOS 72  BILITOT 0.6  PROT 7.3  ALBUMIN 3.3*   No results for input(s): LIPASE, AMYLASE in the last 168 hours. No results for input(s): AMMONIA in the last 168 hours. Coagulation Profile: No results for input(s): INR, PROTIME in the last 168 hours. Cardiac Enzymes: No results for input(s): CKTOTAL, CKMB, CKMBINDEX, TROPONINI in the last 168 hours. BNP (last 3 results) No results for input(s): PROBNP in the last 8760 hours. HbA1C: No results for input(s): HGBA1C in the last 72 hours. CBG: Recent Labs  Lab 09/03/18 0227 09/03/18 0611  GLUCAP 248* 283*   Lipid Profile: No results for input(s): CHOL, HDL, LDLCALC, TRIG, CHOLHDL, LDLDIRECT in the last 72 hours. Thyroid Function Tests: No results for input(s): TSH, T4TOTAL, FREET4, T3FREE, THYROIDAB in the last 72 hours. Anemia Panel: No results for input(s): VITAMINB12, FOLATE, FERRITIN, TIBC, IRON, RETICCTPCT in the last 72 hours.    Radiology Studies: I have reviewed all of the imaging during this hospital visit personally     Scheduled Meds: .  stroke: mapping our early stages of recovery book   Does not apply Once  . aspirin EC  81 mg Oral Daily  . [START ON 09/04/2018] clopidogrel  75 mg Oral Daily  . enoxaparin (LOVENOX) injection  40 mg Subcutaneous Q24H  . insulin aspart  0-5 Units Subcutaneous QHS  . insulin aspart  0-9 Units Subcutaneous TID WC   Continuous Infusions: . sodium chloride 75 mL/hr at 09/03/18 0546     LOS: 0 days        Angeleigh Chiasson Gerome Apley, MD [facility] Pager [phone]

## 2018-09-03 NOTE — Progress Notes (Signed)
Notified attending of patient refusing insulin

## 2018-09-03 NOTE — Evaluation (Addendum)
Physical Therapy Evaluation Patient Details Name: Wendy Underwood MRN: [id number] DOB: 1951-11-17 Today's Date: 09/03/2018   History of Present Illness  Pt is a 67 y/o female admitted secondary to gait instability. MRI revealed multiple small ischemic infarcts in the L hemisphere, the largest of which measures 8 mm and is located in the corona radiata. PMH including but not limited to DM, HLD and ADHD.    Clinical Impression  Pt presented supine in bed with HOB elevated, awake and willing to participate in therapy session. Prior to admission, pt reported that she was independent with all functional mobility and ADLs. Pt lives in a second floor apartment with 17 steps to enter. She is currently able to perform bed mobility with supervision, transfers with min guard and ambulate in hallway with min guard to min A for balance/safety without use of an AD. Pt participated in higher level balance assessment and scored a 19/24 on the DGI, indicating she is at an increased fall risk. Pt would greatly benefit from further intensive therapies at CIR prior to returning home. She appears to have the potential to make great progress and is eager to return to PLOF. PT will continue to follow acutely to progress mobility as tolerated.     Follow Up Recommendations CIR;Supervision/Assistance - 24 hour    Equipment Recommendations  None recommended by PT    Recommendations for Other Services Rehab consult     Precautions / Restrictions Precautions Precautions: Fall Restrictions Weight Bearing Restrictions: No      Mobility  Bed Mobility Overal bed mobility: Needs Assistance Bed Mobility: Supine to Sit     Supine to sit: Supervision     General bed mobility comments: increased time, supervision for safety  Transfers Overall transfer level: Needs assistance Equipment used: None Transfers: Sit to/from Stand Sit to Stand: Min guard         General transfer comment: min guard for safety  with transition into standing  Ambulation/Gait Ambulation/Gait assistance: Min assist;Min guard Gait Distance (Feet): 100 Feet Assistive device: None Gait Pattern/deviations: Step-through pattern;Decreased stride length;Drifts right/left Gait velocity: decreased   General Gait Details: pt with modest instability with ambulation without using an AD, occasional min A required for balance with pt having minor LOBs laterally when ambulating in hallway and around obstacles  Stairs Stairs: Yes Stairs assistance: Min guard;Min assist Stair Management: One rail Left;Alternating pattern;Forwards Number of Stairs: 2(x2 trials) General stair comments: pt with instability and LOB with descending during first trial, min A for stability  Wheelchair Mobility    Modified Rankin (Stroke Patients Only) Modified Rankin (Stroke Patients Only) Pre-Morbid Rankin Score: No symptoms Modified Rankin: Slight disability     Balance Overall balance assessment: Needs assistance Sitting-balance support: Feet supported Sitting balance-Leahy Scale: Good     Standing balance support: No upper extremity supported;During functional activity Standing balance-Leahy Scale: Poor Standing balance comment: static standing balance is fair and dynamic is poor                 Standardized Balance Assessment Standardized Balance Assessment : Dynamic Gait Index   Dynamic Gait Index Level Surface: Normal Change in Gait Speed: Mild Impairment Gait with Horizontal Head Turns: Mild Impairment Gait with Vertical Head Turns: Normal Gait and Pivot Turn: Mild Impairment Step Over Obstacle: Normal Step Around Obstacles: Normal Steps: Moderate Impairment Total Score: 19       Pertinent Vitals/Pain Pain Assessment: No/denies pain    Home Living Family/patient expects to be discharged to::  Private residence Living Arrangements: Alone Available Help at Discharge: Friend(s);Available PRN/intermittently Type  of Home: Apartment Home Access: Stairs to enter Entrance Stairs-Rails: Psychiatric nurse of Steps: 17 Home Layout: One level Home Equipment: None      Prior Function Level of Independence: Independent         Comments: works     Journalist, newspaper        Extremity/Trunk Assessment   Upper Extremity Assessment Upper Extremity Assessment: Defer to OT evaluation;RUE deficits/detail RUE Coordination: decreased fine motor;decreased gross motor    Lower Extremity Assessment Lower Extremity Assessment: Overall WFL for tasks assessed       Communication   Communication: No difficulties  Cognition Arousal/Alertness: Awake/alert Behavior During Therapy: WFL for tasks assessed/performed Overall Cognitive Status: Impaired/Different from baseline Area of Impairment: Safety/judgement;Problem solving                         Safety/Judgement: Decreased awareness of deficits;Decreased awareness of safety   Problem Solving: Slow processing;Difficulty sequencing;Requires verbal cues        General Comments      Exercises     Assessment/Plan    PT Assessment Patient needs continued PT services  PT Problem List Decreased balance;Decreased mobility;Decreased coordination;Decreased knowledge of use of DME;Decreased safety awareness;Decreased knowledge of precautions       PT Treatment Interventions DME instruction;Gait training;Stair training;Functional mobility training;Therapeutic activities;Therapeutic exercise;Balance training;Neuromuscular re-education;Cognitive remediation;Patient/family education    PT Goals (Current goals can be found in the Care Plan section)  Acute Rehab PT Goals Patient Stated Goal: return to PLOF PT Goal Formulation: With patient Time For Goal Achievement: 09/17/18 Potential to Achieve Goals: Good    Frequency Min 4X/week   Barriers to discharge        Co-evaluation               AM-PAC PT "6 Clicks" Daily  Activity  Outcome Measure Difficulty turning over in bed (including adjusting bedclothes, sheets and blankets)?: None Difficulty moving from lying on back to sitting on the side of the bed? : None Difficulty sitting down on and standing up from a chair with arms (e.g., wheelchair, bedside commode, etc,.)?: A Little Help needed moving to and from a bed to chair (including a wheelchair)?: None Help needed walking in hospital room?: A Little Help needed climbing 3-5 steps with a railing? : A Little 6 Click Score: 21    End of Session Equipment Utilized During Treatment: Gait belt Activity Tolerance: Patient tolerated treatment well Patient left: with call bell/phone within reach;with nursing/sitter in room;Other (comment)(transport present to take pt for test) Nurse Communication: Mobility status PT Visit Diagnosis: Other abnormalities of gait and mobility (R26.89);Other symptoms and signs involving the nervous system (R29.898)    Time: 1010-1033 PT Time Calculation (min) (ACUTE ONLY): 23 min   Charges:   PT Evaluation $PT Eval Moderate Complexity: 1 Mod PT Treatments $Gait Training: 8-22 mins        Sherie Don, [place], DPT  Acute Rehabilitation Services Pager [phone] Office [place] 09/03/2018, 12:35 PM

## 2018-09-03 NOTE — Progress Notes (Signed)
Occupational Therapy Evaluation Patient Details Name: Wendy Underwood MRN: [id number] DOB: 11-20-1951 Today's Date: 09/03/2018    History of Present Illness Pt is a 67 y/o female admitted secondary to gait instability. MRI revealed multiple small ischemic infarcts in the L hemisphere, the largest of which measures 8 mm and is located in the corona radiata. PMH including but not limited to DM, HLD and ADHD.   Clinical Impression   PTA,pt lived independently, drove and worked full-time. Pt [facility] into her apt. Pt demonstrates a decline in PLOF and requires min A with dynamic ADL tasks and functional mobility due to deficits listed below. Feel pt can achieve modified independence with ADL and IADL tasks with short CIR stay. Will continue to follow acutely to further assess appropriate DC needs.     Follow Up Recommendations  CIR    Equipment Recommendations  3 in 1 bedside commode    Recommendations for Other Services Rehab consult     Precautions / Restrictions Precautions Precautions: Fall Restrictions Weight Bearing Restrictions: No      Mobility Bed Mobility Overal bed mobility: Modified Independent Bed Mobility: Supine to Sit       Transfers Overall transfer level: Needs assistance Equipment used: None Transfers: Sit to/from Stand Sit to Stand: Min guard         General transfer comment: min guard for safety with transition into standing    Balance Overall balance assessment: Needs assistance Sitting-balance support: Feet supported Sitting balance-Leahy Scale: Good     Standing balance support: No upper extremity supported;During functional activity Standing balance-Leahy Scale: Poor Standing balance comment: static standing balance is fair and dynamic is poor; LOB during dynamic task                     ADL either performed or assessed with clinical judgement   ADL Overall ADL's : Needs  assistance/impaired Eating/Feeding: Modified independent   Grooming: Set up;Supervision/safety;Standing   Upper Body Bathing: Set up;Sitting   Lower Body Bathing: Min guard;Sit to/from stand   Upper Body Dressing : Set up;Sitting   Lower Body Dressing: Min guard;Sit to/from stand   Toilet Transfer: Min guard;Ambulation   Toileting- Clothing Manipulation and Hygiene: Supervision/safety;Sit to/from stand       Functional mobility during ADLs: Min guard General ADL Comments: At risk for falls during ADL. While stnaidng at sink, unaware of close stance adn lost balance toward R - able to recover     Vision Baseline Vision/History: Wears glasses Wears Glasses: Reading only Vision Assessment?: Yes Eye Alignment: Within Functional Limits Ocular Range of Motion: Within Functional Limits Alignment/Gaze Preference: Within Defined Limits Tracking/Visual Pursuits: Able to track stimulus in all quads without difficulty Saccades: Within functional limits Convergence: Within functional limits Visual Fields: No apparent deficits     Agricultural engineer Tested?: (appears [facility])   Praxis Praxis Praxis tested?: Within functional limits    Pertinent Vitals/Pain Pain Assessment: No/denies pain     Hand Dominance Right   Extremity/Trunk Assessment Upper Extremity Assessment Upper Extremity Assessment: RUE deficits/detail RUE Deficits / Details: isolated movemetn ; using funcitonally; "clumsy hand". decreased fine motor/coordination RUE Sensation: ("feels diffierent") RUE Coordination: decreased fine motor;decreased gross motor   Lower Extremity Assessment Lower Extremity Assessment: RLE deficits/detail RLE Deficits / Details: Decreased coordination; overall weaker than R   Cervical / Trunk Assessment Cervical / Trunk Assessment: Normal   Communication Communication Communication: No difficulties   Cognition Arousal/Alertness: Awake/alert  Behavior During Therapy:  WFL for tasks assessed/performed Overall Cognitive Status: Impaired/Different from baseline Area of Impairment: Safety/judgement;Problem solving;Awareness                         Safety/Judgement: Decreased awareness of deficits;Decreased awareness of safety Awareness: Emergent Problem Solving: Slow processing;Requires verbal cues General Comments: Difficulty with serial subtraction; lost attention to task   General Comments       Exercises     Shoulder Instructions      Home Living Family/patient expects to be discharged to:: Private residence Living Arrangements: Alone Available Help at Discharge: Friend(s);Available PRN/intermittently Type of Home: Apartment Home Access: Stairs to enter Entrance Stairs-Number of Steps: 17 Entrance Stairs-Rails: Right;Left Home Layout: One level     Bathroom Shower/Tub: Teacher, early years/pre: Standard Bathroom Accessibility: Yes How Accessible: Accessible via walker Home Equipment: None          Prior Functioning/Environment Level of Independence: Independent        Comments: works; drives; recently moved into a government subsidized apt from the [employer]         OT Problem List: Decreased strength;Decreased activity tolerance;Impaired balance (sitting and/or standing);Decreased coordination;Decreased safety awareness;Decreased knowledge of use of DME or AE;Impaired sensation;Impaired UE functional use      OT Treatment/Interventions: Self-care/ADL training;Therapeutic exercise;Neuromuscular education;DME and/or AE instruction;Therapeutic activities;Cognitive remediation/compensation;Visual/perceptual remediation/compensation;Patient/family education;Balance training    OT Goals(Current goals can be found in the care plan section) Acute Rehab OT Goals Patient Stated Goal: return to PLOF and work OT Goal Formulation: With patient Time For Goal Achievement: 09/17/18 Potential to  Achieve Goals: Good  OT Frequency: Min 3X/week   Barriers to D/C:            Co-evaluation              AM-PAC PT "6 Clicks" Daily Activity     Outcome Measure Help from another person eating meals?: None Help from another person taking care of personal grooming?: A Little Help from another person toileting, which includes using toliet, bedpan, or urinal?: A Little Help from another person bathing (including washing, rinsing, drying)?: A Little Help from another person to put on and taking off regular upper body clothing?: None Help from another person to put on and taking off regular lower body clothing?: A Little 6 Click Score: 20   End of Session Equipment Utilized During Treatment: Gait belt Nurse Communication: Mobility status  Activity Tolerance: Patient tolerated treatment well Patient left: in bed;with call bell/phone within reach;with bed alarm set  OT Visit Diagnosis: Other abnormalities of gait and mobility (R26.89);Muscle weakness (generalized) (M62.81);Other symptoms and signs involving cognitive function                Time: 1339-1406 OT Time Calculation (min): 27 min Charges:  OT General Charges $OT Visit: 1 Visit OT Evaluation $OT Eval Moderate Complexity: 1 Mod OT Treatments $Self Care/Home Management : 8-22 mins  Maurie Boettcher, OT/L   Acute OT Clinical Specialist Acute Rehabilitation Services Pager [phone] Office [phone]   [facility] 09/03/2018, 2:17 PM

## 2018-09-03 NOTE — Progress Notes (Signed)
STROKE TEAM PROGRESS NOTE   SUBJECTIVE (INTERVAL HISTORY) Her RN is at the bedside.  Pt just had OT session and recommended CIR due to wobbly gait.  Patient sugar still high, however refused insulin injection.  She insists that she only wants p.o. medication for diabetic control.  Apparently, she did not take any p.o. medication at home for diabetes.  Still has subtle weakness on the right hand, otherwise neurologically intact.  OBJECTIVE Vitals:   09/03/18 0415 09/03/18 0445 09/03/18 0517 09/03/18 0806  BP: (!) 126/56 126/60 (!) 156/59 (!) 158/72  Pulse: 65  65 70  Resp:   18 18  Temp:   98.4 F (36.9 C) 99.6 F (37.6 C)  TempSrc:   Oral Oral  SpO2: 96%  100% 99%  Weight:   66.7 kg   Height:   5\' 5"  (1.651 m)     CBC:  Recent Labs  Lab 09/02/18 1739  WBC 7.6  NEUTROABS 3.9  HGB 16.3*  HCT 50.1*  MCV 90.4  PLT 627    Basic Metabolic Panel:  Recent Labs  Lab 09/02/18 1739  NA 136  K 4.4  CL 102  CO2 26  GLUCOSE 402*  BUN 6*  CREATININE 0.78  CALCIUM 9.2    Lipid Panel:     Component Value Date/Time   CHOL 166 05/30/2015 0926   TRIG 151 (H) 05/30/2015 0926   HDL 28 (L) 05/30/2015 0926   CHOLHDL 5.9 05/30/2015 0926   VLDL 30 05/30/2015 0926   LDLCALC 108 (H) 05/30/2015 0926   HgbA1c:  Lab Results  Component Value Date   HGBA1C 9.0 05/10/2015   Urine Drug Screen:     Component Value Date/Time   LABOPIA NONE DETECTED 09/03/2018 0718   COCAINSCRNUR NONE DETECTED 09/03/2018 0718   LABBENZ NONE DETECTED 09/03/2018 0718   AMPHETMU NONE DETECTED 09/03/2018 0718   THCU NONE DETECTED 09/03/2018 0718   LABBARB NONE DETECTED 09/03/2018 0718    Alcohol Level No results found for: [facility]  IMAGING   Mr Brain Wo Contrast 09/03/2018 IMPRESSION:  1. Multiple small ischemic infarcts the left hemisphere, the largest of which measures 8 mm and is located in the corona radiata.  2. No hemorrhage or mass effect.  3. Otherwise normal brain for age.     Transthoracic Echocardiogram - pending   CTA head and neck pending   Bilateral Carotid Dopplers -  40-59% ICA stenosis, bilaterally.  Significant soft plaque noted at the left proximal ICA.  Bilateral vertebral artery flow is antegrade.     PHYSICAL EXAM Temp:  [98.2 F (36.8 C)-99.6 F (37.6 C)] 98.5 F (36.9 C) (10/12 1238) Pulse Rate:  [60-88] 64 (10/12 1238) Resp:  [17-18] 18 (10/12 1238) BP: (125-158)/(52-86) 134/63 (10/12 1238) SpO2:  [96 %-100 %] 99 % (10/12 1238) Weight:  [66.7 kg] 66.7 kg (10/12 0517)  General - Well nourished, well developed, in no apparent distress.  Ophthalmologic - fundi not visualized due to noncooperation.  Cardiovascular - Regular rate and rhythm.  Mental Status -  Level of arousal and orientation to time, place, and person were intact. Language including expression, naming, repetition, comprehension was assessed and found intact. Fund of Knowledge was assessed and was intact.  Cranial Nerves II - XII - II - Visual field intact OU. III, IV, VI - Extraocular movements intact. V - Facial sensation intact bilaterally. VII - Facial movement intact bilaterally. VIII - Hearing & vestibular intact bilaterally. X - Palate elevates symmetrically. XI Geryl Councilman  turning & shoulder shrug intact bilaterally. XII - Tongue protrusion intact.  Motor Strength - The patient's strength was normal in all extremities except mild left hand finger grip weakness and pronator drift was absent.  Bulk was normal and fasciculations were absent.   Motor Tone - Muscle tone was assessed at the neck and appendages and was normal.  Reflexes - The patient's reflexes were symmetrical in all extremities and she had no pathological reflexes.  Sensory - Light touch, temperature/pinprick were assessed and were symmetrical.    Coordination - The patient had normal movements in the hands and feet with no ataxia or dysmetria.  Tremor was absent.  Gait and Station -  deferred.   ASSESSMENT/PLAN Wendy Underwood is a 67 y.o. female with history of diabetes mellitus, lipidemia intolerant to statin,ADHD,prior smoker  presenting with a 2-day history of gait imbalance and weakness in the right hand. She did not receive IV t-PA due to late presentation.  Stroke: Left CR/SO small infarct, likely due to small vessel disease  Resultant mild left finger grip weakness  MRI head - small ischemic infarcts the left corona radiata and semiovale.   CT head and neck pending  Carotid Doppler - 40 to 59% ICA stenosis bilaterally  2D Echo - pending  LDL - pending  HgbA1c - pending  VTE prophylaxis - Lovenox  Diet  - Carb modified with thin liquids  No antithrombotic prior to admission, now on aspirin 81 mg daily and clopidogrel 75 mg daily.  Continue DAPT for 3 weeks and then aspirin alone  Patient counseled to be compliant with her antithrombotic medications  Ongoing aggressive stroke risk factor management  Therapy recommendations:  pending  Disposition:  Pending  Hypertension  Stable . Permissive hypertension (OK if < 220/120) but gradually normalize in 5-7 days . Long-term BP goal normotensive  Hyperlipidemia  Lipid lowering medication PTA:  none  LDL pending, goal < 70  Intolerance to Lipitor  Consider other statins once LDL available  Diabetes  HgbA1c pending, goal < 7.0  Uncontrolled  Severe hyperglycemia  CBG monitoring  Patient refused insulin injection/SSI  Patient prefer p.o. Medications  Defer to primary service for management  Other Stroke Risk Factors  Advanced age  Former cigarette smoker - quit  Other Active Problems  ADHD  Hospital day # 0  [name], MD PhD Stroke Neurology 09/03/2018 2:46 PM    To contact Stroke Continuity provider, please refer to [email or web address] After hours, contact General Neurology

## 2018-09-03 NOTE — Consult Note (Signed)
Requesting Physician: Dr. Betsey Holiday    Chief Complaint: Gait imabalance  History obtained from: Patient and Chart  HPI:                                                                                                                                       Wendy Underwood is an 67 y.o. female with past medical history of diabetes mellitus, lipidemia intolerant to statin,  ADHD, prior smoker who presents to the emergency room with 2-day history of gait imbalance and weakness in the right hand.   Her symptoms started around 8 PM on Wednesday night she was walking back from work.  She suddenly felt unsteady on her feet.  She denied any dizziness.  Patient was in the process of moving into her new apartment and thought her symptoms were due to general fatigue.  She also noticed she had trouble using her right hand around the same time.  Her symptoms persisted and she decided to come to the emergency room.  MRI brain revealed multiple small punctate subacute infarcts in the left hemisphere, most notably in the left coronary radiata ( only obvious one)   She was admitted and neurology was consulted for further recommendations.    Date last known well: 08/31/2018 Time last known well: 8 PM tPA Given: No outside window NIHSS: 0 Baseline MRS 0     Past Medical History:  Diagnosis Date  . ADD (attention deficit disorder)   . ADHD (attention deficit hyperactivity disorder)   . Anxiety   . Diabetes mellitus   . GERD (gastroesophageal reflux disease)   . Hyperlipidemia   . Menopause     Past Surgical History:  Procedure Laterality Date  . FOOT SURGERY  2012  . [place]  . HAMMERTOE RECONSTRUCTION WITH WEIL OSTEOTOMY  10/27/2012   Procedure: HAMMERTOE RECONSTRUCTION WITH WEIL OSTEOTOMY;  Surgeon: [name], MD;  Location: [place];  Service: Orthopedics;  Laterality: Right;  Right Second, Third and Fouth  Hammertoe Correction; Right Second and Third Metatarsal  Weil Osteotomy; Second and Third Dorsal Capsulotomy;  Second Solicitor  . KNEE ARTHROSCOPY  2008 and 2009   both knees  . MOUTH SURGERY  2010  . varicose veins  2011and 1991  . WRIST SURGERY     cyst removal    Family History  Problem Relation Age of Onset  . Heart attack Maternal Grandfather   . Hypertension Maternal Grandfather   . Heart attack Father   . Cancer Mother        skin  . Schizophrenia Mother   . Depression Mother   . Diabetes Mother   . Hypertension Mother   . Cancer Maternal Uncle        skin  . Emphysema Maternal Aunt   . ADD / ADHD Son    Social History:  reports that she quit smoking about  11 years ago. Her smoking use included cigarettes. She has a 315.00 pack-year smoking history. She has never used smokeless tobacco. She reports that she does not drink alcohol or use drugs.  Allergies:  Allergies  Allergen Reactions  . Atorvastatin Other (See Comments)    Muscles hurt  . Statins Other (See Comments)    Muscle aches    Medications:                                                                                                                        I reviewed home medications.  Not compliant on aspirin   ROS:                                                                                                                                     14 systems reviewed and negative except above   Examination:                                                                                                      General: Appears well-developed  Psych: Affect appropriate to situation Eyes: No scleral injection HENT: No OP obstrucion Head: Normocephalic.  Cardiovascular: Normal rate and regular rhythm.  Respiratory: Effort normal and breath sounds normal to anterior ascultation GI: Soft.  No distension. There is no tenderness.  Skin: WDI    Neurological Examination Mental Status: Alert, oriented, thought content appropriate.  Speech fluent  without evidence of aphasia. Able to follow 3 step commands without difficulty. Cranial Nerves: II: Visual fields grossly normal,  III,IV, VI: ptosis not present, extra-ocular motions intact bilaterally, pupils equal, round, reactive to light and accommodation V,VII: smile symmetric, facial light touch sensation normal bilaterally VIII: hearing normal bilaterally IX,X: uvula rises symmetrically XI: bilateral shoulder shrug XII: midline tongue extension Motor: Right : Upper extremity   5/5, slightly reduced finger tapping   left:     Upper extremity   5/5  Lower extremity   5/5  Lower extremity   5/5 Tone and bulk:normal tone throughout; no atrophy noted Sensory: Pinprick and light touch intact throughout, bilaterally Deep Tendon Reflexes: 2+ and symmetric throughout Plantars: Right: downgoing   Left: downgoing Cerebellar: No dysmetria noted on right arm when compared to the left Gait: normal gait and station     Lab Results: Basic Metabolic Panel: Recent Labs  Lab 09/02/18 1739  NA 136  K 4.4  CL 102  CO2 26  GLUCOSE 402*  BUN 6*  CREATININE 0.78  CALCIUM 9.2    CBC: Recent Labs  Lab 09/02/18 1739  WBC 7.6  NEUTROABS 3.9  HGB 16.3*  HCT 50.1*  MCV 90.4  PLT 312    Coagulation Studies: No results for input(s): LABPROT, INR in the last 72 hours.  Imaging: Mr Brain Wo Contrast  Result Date: 09/03/2018 CLINICAL DATA:  Balance problems EXAM: MRI HEAD WITHOUT CONTRAST TECHNIQUE: Multiplanar, multiecho pulse sequences of the brain and surrounding structures were obtained without intravenous contrast. COMPARISON:  None. FINDINGS: BRAIN: There are multiple foci of abnormal diffusion restriction within the left frontal and parietal lobes. The largest measures approximately 8 mm and is located in the corona radiata. There are multiple punctate subcortical foci that measure up to a 3-4 mm. The midline structures are normal. No midline shift or other mass effect. There  are no old infarcts. The white matter signal is normal for the patient's age. The cerebral and cerebellar volume are age-appropriate. Susceptibility-sensitive sequences show no chronic microhemorrhage or superficial siderosis. VASCULAR: Major intracranial arterial and venous sinus flow voids are normal. SKULL AND UPPER CERVICAL SPINE: Calvarial bone marrow signal is normal. There is no skull base mass. Visualized upper cervical spine and soft tissues are normal. SINUSES/ORBITS: No fluid levels or advanced mucosal thickening. No mastoid or middle ear effusion. The orbits are normal. IMPRESSION: 1. Multiple small ischemic infarcts the left hemisphere, the largest of which measures 8 mm and is located in the corona radiata. 2. No hemorrhage or mass effect. 3. Otherwise normal brain for age. Electronically Signed   By: Ulyses Jarred M.D.   On: 09/03/2018 01:24     ASSESSMENT AND PLAN   67 y.o. female with past medical history of diabetes mellitus, lipidemia intolerant to statin,  ADHD, prior smoker who presents to the emergency room with 2-day history of gait imbalance and weakness in the right hand.  Prior read as multiple infarcts in the left frontal and parietal lobe, although only left coronary radiata appears acute.    Left MCA stroke   Risk factors : DM, HLD Etiology: lacunar vs athero vs cardioembolic  Recommend # MRI of the brain without contrast #MRA Head and neck  #Transthoracic Echo  # Start patient on ASA 81mg  daily, will add Plavix for 3 weeks #Consider starting statin low dose and adding coenzyme Q 10 # BP goal: permissive HTN upto 220/120 mmHg ( 185/110 if patient has CHF, CKD) # HBAIC and Lipid profile # Telemetry monitoring # Frequent neuro checks #  stroke swallow screen  Please page stroke NP  Or  PA  Or MD from 8am -4 pm  as this patient from this time will be  followed by the stroke.   You can look them up on [email or web address]  Password [facility]    Amra Shukla [facility] Pager Number [phone]

## 2018-09-03 NOTE — Progress Notes (Signed)
Rehab Admissions Coordinator Note:  Per PT and OT recommendation, Patient was screened by Jhonnie Garner for appropriateness for an Inpatient Acute Rehab Consult.  At this time, we are recommending Inpatient Rehab consult. AC will contact MD to request IP rehab consult order.    Jhonnie Garner 09/03/2018, 2:27 PM  I can be reached at [phone].

## 2018-09-03 NOTE — ED Notes (Signed)
Patient transported to MRI 

## 2018-09-03 NOTE — Progress Notes (Signed)
VASCULAR LAB PRELIMINARY  PRELIMINARY  PRELIMINARY  PRELIMINARY  Carotid duplex completed.    Preliminary report:  40-59% ICA stenosis, bilaterally.  Significant soft plaque noted at the left proximal ICA.  Bilateral vertebral artery flow is antegrade.   Wendy Underwood, RVT 09/03/2018, 11:01 AM

## 2018-09-03 NOTE — ED Provider Notes (Signed)
Patient signed out to me to follow-up on her MRI.  Patient has a 2-day history of feeling off balance, difficulty with walking as well as some mild tingling and weakness of her right arm and leg.  MRI has been performed and shows numerous areas of small infarct.  Neurology consulted, will see the patient and patient will be admitted for further management.  Patient had initially refused treatment for her elevated blood sugar.  Will give insulin.  Patient is in agreement with this plan.  Results for orders placed or performed during the hospital encounter of 09/02/18  Comprehensive metabolic panel  Result Value Ref Range   Sodium 136 135 - 145 mmol/L   Potassium 4.4 3.5 - 5.1 mmol/L   Chloride 102 98 - 111 mmol/L   CO2 26 22 - 32 mmol/L   Glucose, Bld 402 (H) 70 - 99 mg/dL   BUN 6 (L) 8 - 23 mg/dL   Creatinine, Ser 0.78 0.44 - 1.00 mg/dL   Calcium 9.2 8.9 - 10.3 mg/dL   Total Protein 7.3 6.5 - 8.1 g/dL   Albumin 3.3 (L) 3.5 - 5.0 g/dL   AST 16 15 - 41 U/L   ALT 15 0 - 44 U/L   Alkaline Phosphatase 72 38 - 126 U/L   Total Bilirubin 0.6 0.3 - 1.2 mg/dL   GFR calc non Af Amer >60 >60 mL/min   GFR calc Af Amer >60 >60 mL/min   Anion gap 8 5 - 15  CBC with Differential  Result Value Ref Range   WBC 7.6 4.0 - 10.5 K/uL   RBC 5.54 (H) 3.87 - 5.11 MIL/uL   Hemoglobin 16.3 (H) 12.0 - 15.0 g/dL   HCT 50.1 (H) 36.0 - 46.0 %   MCV 90.4 80.0 - 100.0 fL   MCH 29.4 26.0 - 34.0 pg   MCHC 32.5 30.0 - 36.0 g/dL   RDW 11.9 11.5 - 15.5 %   Platelets 312 150 - 400 K/uL   nRBC 0.0 0.0 - 0.2 %   Neutrophils Relative % 50 %   Neutro Abs 3.9 1.7 - 7.7 K/uL   Lymphocytes Relative 40 %   Lymphs Abs 3.0 0.7 - 4.0 K/uL   Monocytes Relative 6 %   Monocytes Absolute 0.5 0.1 - 1.0 K/uL   Eosinophils Relative 2 %   Eosinophils Absolute 0.1 0.0 - 0.5 K/uL   Basophils Relative 1 %   Basophils Absolute 0.1 0.0 - 0.1 K/uL   Immature Granulocytes 1 %   Abs Immature Granulocytes 0.06 0.00 - 0.07 K/uL   Urinalysis, Routine w reflex microscopic  Result Value Ref Range   Color, Urine YELLOW YELLOW   APPearance CLEAR CLEAR   Specific Gravity, Urine 1.028 1.005 - 1.030   pH 5.0 5.0 - 8.0   Glucose, UA >=500 (A) NEGATIVE mg/dL   Hgb urine dipstick NEGATIVE NEGATIVE   Bilirubin Urine NEGATIVE NEGATIVE   Ketones, ur NEGATIVE NEGATIVE mg/dL   Protein, ur NEGATIVE NEGATIVE mg/dL   Nitrite NEGATIVE NEGATIVE   Leukocytes, UA SMALL (A) NEGATIVE   RBC / HPF 0-5 0 - 5 RBC/hpf   WBC, UA 11-20 0 - 5 WBC/hpf   Bacteria, UA RARE (A) NONE SEEN   Squamous Epithelial / LPF 0-5 0 - 5   Mucus PRESENT    Mr Brain Wo Contrast  Result Date: 09/03/2018 CLINICAL DATA:  Balance problems EXAM: MRI HEAD WITHOUT CONTRAST TECHNIQUE: Multiplanar, multiecho pulse sequences of the brain and surrounding structures were  obtained without intravenous contrast. COMPARISON:  None. FINDINGS: BRAIN: There are multiple foci of abnormal diffusion restriction within the left frontal and parietal lobes. The largest measures approximately 8 mm and is located in the corona radiata. There are multiple punctate subcortical foci that measure up to a 3-4 mm. The midline structures are normal. No midline shift or other mass effect. There are no old infarcts. The white matter signal is normal for the patient's age. The cerebral and cerebellar volume are age-appropriate. Susceptibility-sensitive sequences show no chronic microhemorrhage or superficial siderosis. VASCULAR: Major intracranial arterial and venous sinus flow voids are normal. SKULL AND UPPER CERVICAL SPINE: Calvarial bone marrow signal is normal. There is no skull base mass. Visualized upper cervical spine and soft tissues are normal. SINUSES/ORBITS: No fluid levels or advanced mucosal thickening. No mastoid or middle ear effusion. The orbits are normal. IMPRESSION: 1. Multiple small ischemic infarcts the left hemisphere, the largest of which measures 8 mm and is located in the corona  radiata. 2. No hemorrhage or mass effect. 3. Otherwise normal brain for age. Electronically Signed   By: Ulyses Jarred M.D.   On: 09/03/2018 01:24      Orpah Greek, MD 09/03/18 [phone]

## 2018-09-03 NOTE — Progress Notes (Signed)
OT Cancellation Note  Patient Details Name: Wendy Underwood MRN: [id number] DOB: 02-12-1951   Cancelled Treatment:    Reason Eval/Treat Not Completed: Patient at procedure or test/ unavailable(vascular)  [place], OT/L   Acute OT Clinical Specialist Acute Rehabilitation Services Pager [phone] Office [phone]  09/03/2018, 11:12 AM

## 2018-09-04 DIAGNOSIS — E785 Hyperlipidemia, unspecified: Secondary | ICD-10-CM

## 2018-09-04 DIAGNOSIS — I69398 Other sequelae of cerebral infarction: Secondary | ICD-10-CM

## 2018-09-04 DIAGNOSIS — R269 Unspecified abnormalities of gait and mobility: Secondary | ICD-10-CM

## 2018-09-04 DIAGNOSIS — E139 Other specified diabetes mellitus without complications: Secondary | ICD-10-CM

## 2018-09-04 DIAGNOSIS — I10 Essential (primary) hypertension: Secondary | ICD-10-CM

## 2018-09-04 DIAGNOSIS — I4891 Unspecified atrial fibrillation: Secondary | ICD-10-CM

## 2018-09-04 LAB — LIPID PANEL
CHOL/HDL RATIO: 6.9 ratio
CHOLESTEROL: 180 mg/dL (ref 0–200)
HDL: 26 mg/dL — AB (ref >40)
LDL CALC: 124 mg/dL — AB (ref 0–99)
TRIGLYCERIDES: 150 mg/dL — AB (ref <150)
VLDL: 30 mg/dL (ref 0–40)

## 2018-09-04 LAB — GLUCOSE, CAPILLARY: Glucose-Capillary: 198 mg/dL — ABNORMAL HIGH (ref 70–99)

## 2018-09-04 LAB — HEMOGLOBIN A1C
HEMOGLOBIN A1C: 11.3 % — AB (ref 4.8–5.6)
Mean Plasma Glucose: 277.61 mg/dL

## 2018-09-04 MED ORDER — OMEGA-3-ACID ETHYL ESTERS 1 G PO CAPS
1.0000 g | ORAL_CAPSULE | Freq: Every day | ORAL | Status: DC
  Administered 2018-09-04: 1 g via ORAL
  Filled 2018-09-04: qty 1

## 2018-09-04 MED ORDER — ATORVASTATIN CALCIUM 10 MG PO TABS
20.0000 mg | ORAL_TABLET | Freq: Every day | ORAL | Status: DC
  Administered 2018-09-04: 20 mg via ORAL
  Filled 2018-09-04: qty 2

## 2018-09-04 NOTE — Progress Notes (Addendum)
STROKE TEAM PROGRESS NOTE   SUBJECTIVE (INTERVAL HISTORY) Her RN is at the bedside.  Pt just had OT session and recommended CIR due to wobbly gait.  Patient sugar still high, however refused insulin injection.  She insists that she only wants p.o. medication for diabetic control.  Apparently, she did not take any p.o. medication at home for diabetes.  Still has subtle weakness on the right hand, otherwise neurologically intact.  OBJECTIVE Vitals:   09/03/18 1927 09/03/18 2352 09/04/18 0338 09/04/18 0830  BP: (!) 120/45 105/63 (!) 119/53 133/68  Pulse: (!) 58 60 (!) 56 68  Resp: 18 20 20 16   Temp: 98.4 F (36.9 C) 98 F (36.7 C) 97.9 F (36.6 C) 98.5 F (36.9 C)  TempSrc: Oral Oral Oral Oral  SpO2: 97% 99% 99% 97%  Weight:      Height:        CBC:  Recent Labs  Lab 09/02/18 1739  WBC 7.6  NEUTROABS 3.9  HGB 16.3*  HCT 50.1*  MCV 90.4  PLT 109    Basic Metabolic Panel:  Recent Labs  Lab 09/02/18 1739  NA 136  K 4.4  CL 102  CO2 26  GLUCOSE 402*  BUN 6*  CREATININE 0.78  CALCIUM 9.2    Lipid Panel:     Component Value Date/Time   CHOL 180 09/04/2018 0518   TRIG 150 (H) 09/04/2018 0518   HDL 26 (L) 09/04/2018 0518   CHOLHDL 6.9 09/04/2018 0518   VLDL 30 09/04/2018 0518   LDLCALC 124 (H) 09/04/2018 0518   HgbA1c:  Lab Results  Component Value Date   HGBA1C 11.3 (H) 09/04/2018   Urine Drug Screen:     Component Value Date/Time   LABOPIA NONE DETECTED 09/03/2018 0718   COCAINSCRNUR NONE DETECTED 09/03/2018 0718   LABBENZ NONE DETECTED 09/03/2018 0718   AMPHETMU NONE DETECTED 09/03/2018 0718   THCU NONE DETECTED 09/03/2018 0718   LABBARB NONE DETECTED 09/03/2018 0718    Alcohol Level No results found for: [facility]  IMAGING   Mr Brain Wo Contrast 09/03/2018 IMPRESSION:  1. Multiple small ischemic infarcts the left hemisphere, the largest of which measures 8 mm and is located in the corona radiata.  2. No hemorrhage or mass effect.  3. Otherwise  normal brain for age.    Transthoracic Echocardiogram - LVEF 60-65%, normal wall thickness, normal wall motion, grade 1   DD, indeterminate LV filling pressure, trivial MR, normal LA   size, normal IVC.   CTA head and neck No flow-limiting extracranial stenosis is observed. Mild nonstenotic irregularity of both carotid bifurcations.  Flow-limiting stenosis of the cavernous segment LEFT ICA, estimated 75%. Otherwise no intracranial anterior circulation stenoses or dissection.  No abnormal postcontrast enhancement.    Bilateral Carotid Dopplers -  40-59% ICA stenosis, bilaterally.  Significant soft plaque noted at the left proximal ICA.  Bilateral vertebral artery flow is antegrade.     PHYSICAL EXAM Temp:  [97.9 F (36.6 C)-98.5 F (36.9 C)] 98.5 F (36.9 C) (10/13 0830) Pulse Rate:  [56-68] 68 (10/13 0830) Resp:  [16-20] 16 (10/13 0830) BP: (105-133)/(45-97) 133/68 (10/13 0830) SpO2:  [97 %-99 %] 97 % (10/13 0830)  General - Well nourished, well developed, in no apparent distress.  Ophthalmologic - fundi not visualized due to noncooperation.  Cardiovascular - Regular rate and rhythm.  Mental Status -  Level of arousal and orientation to time, place, and person were intact. Language including expression, naming, repetition, comprehension was assessed  and found intact. Fund of Knowledge was assessed and was intact.  Cranial Nerves II - XII - II - Visual field intact OU. III, IV, VI - Extraocular movements intact. V - Facial sensation intact bilaterally. VII - Facial movement intact bilaterally. VIII - Hearing & vestibular intact bilaterally. X - Palate elevates symmetrically. XI - Chin turning & shoulder shrug intact bilaterally. XII - Tongue protrusion intact.  Motor Strength - The patient's strength was normal in all extremities except mild right hand finger grip weakness and pronator drift was absent.  Bulk was normal and fasciculations were absent.   Motor Tone  - Muscle tone was assessed at the neck and appendages and was normal.  Reflexes - The patient's reflexes were symmetrical in all extremities and she had no pathological reflexes.  Sensory - Light touch, temperature/pinprick were assessed and were symmetrical.    Coordination - The patient had normal movements in the hands and feet with no ataxia or dysmetria.  Tremor was absent.  Gait and Station - deferred.   ASSESSMENT/PLAN [name] is a 67 y.o. female with history of diabetes mellitus, lipidemia intolerant to statin,ADHD,prior smoker  presenting with a 2-day history of gait imbalance and weakness in the right hand. She did not receive IV t-PA due to late presentation.  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   CT head and neck Flow-limiting stenosis of the cavernous segment LEFT ICA, estimated 75%. Needs Interventional radiology Dr. Estanislado Pandy for eval of stenting and close management of uncontrolled vascular risk factors causing this.  Carotid Doppler - 40 to 59% ICA stenosis bilaterally  2D Echo - LV 60-65% no thrombus  LDL - 124  HgbA1c - 11.3  VTE prophylaxis - Lovenox  Diet  - Carb modified with thin liquids  No antithrombotic prior to admission, now on aspirin 81 mg daily and clopidogrel 75 mg daily.  Continue DAPT for 3 weeks and then aspirin alone  Patient counseled to be compliant with her antithrombotic medications  Ongoing aggressive stroke risk factor management  Therapy recommendations:  pending  Disposition:  Pending  Hypertension  Stable . Permissive hypertension (OK if < 220/120) but gradually normalize in 5-7 days . Long-term BP goal normotensive  Hyperlipidemia  Lipid lowering medication PTA:  none  LDL 124 , goal < 70  Intolerance to Lipitor  Consider other  statins or Praluent/Repatha  Diabetes  HgbA1c 11.3, goal < 7.0  Uncontrolled  Severe hyperglycemia  CBG monitoring  Patient refused insulin injection/SSI  Patient prefer p.o. Medications  Defer to primary service for management  Other Stroke Risk Factors  Advanced age  Former cigarette smoker - quit  Other Active Problems  ADHD  Plan:  Flow-limiting stenosis of the cavernous segment LEFT ICA, estimated 75%. Needs Dr. Estanislado Pandy  referral for stenting, symptomatic causing eboli,  and close management of uncontrolled vascular risk factors causing athersclerosis.  Uncontrolled HGbA1c (11.3) and LDL (124) Need management, start a different statin or repatha/Praluent (she has been intolerant of statin in the past)  Hospital day # 1  Stroke team will sign off at this time  Personally examined patient and images, and have participated in and made any corrections needed to history, physical, neuro exam,assessment and plan as stated above.  I have personally obtained the history, evaluated lab date, reviewed imaging studies  and agree with radiology interpretations.    Sarina Ill, MD Stroke Neurology   To contact Stroke Continuity provider, please refer to [email or web address] After hours, contact General Neurology

## 2018-09-04 NOTE — Progress Notes (Signed)
PROGRESS NOTE    Wendy Underwood  FBP:[id number] DOB: 04-30-1951 DOA: 09/02/2018 PCP: Patient, No Pcp Per    Brief Narrative:  67 year old female who presented with ambulatory dysfunction.  She does have significant past medical history for type 2 diabetes mellitus, dyslipidemia and anxiety.  Reported 3 days of ambulatory dysfunction, decreased balance, no other frank focal deficits.  Blood pressure 125/52, heart rate 63, oxygen saturation 97%.  Moist mucous membranes, lungs clear to auscultation bilaterally, heart S1-S2 present and rhythmic, abdomen soft nontender, no lower extremity edema.  Left-sided weakness 4 out of 5.  Sodium 136, potassium 4.4, chloride 102, bicarb 26, glucose 402, BUN 6, creatinine 0.78, white count 7.6, hemoglobin 16.3, hematocrit 50.1 platelets 312, urinalysis greater than 500 glucose, specific gravity 1.028, 11-20 white cells, 0-5 red cells, drug screen negative.  EKG normal sinus rhythm, normal axis, normal intervals.  Brain MRI with multiple small ischemic infarcts in the left hemisphere, the largest of which measures 8 mm located in the corona radiata.  No hemorrhage or mass-effect.  Patient was admitted to the hospital with working diagnosis of left hemispheric ischemic cerebrovascular accident.     Assessment & Plan:   Active Problems:   Diabetes 1.5, managed as type 2 ([place])   Stroke (cerebrum) (HCC)   Dyslipidemia   Essential hypertension  1. Left hemispheric infarcts. Patient qualifies for CIR, continue physical therapy recommendations. On dual antiplatelet therapy with asa and clopidogrel, patient has agree to trial low dose statin, in the past has muscle cramps. CT angiography with left ICA 75% stenosis, discussed with neurology patient will need vascular evaluation. Echo with normal LV systolic function, carotid US pending result.   2. Dyslipidemia. LDL continue to be elevated, at 124 with Hdl at 26 and triglycerides at 150. Will trail low dose  statin and continue to titrate as tolerated.   3. Uncontrolled T2DM. Glipizide has been resumed, capillary glucose 279, 137 and 198. Patient has declined insulin. Will continue close glucose monitoring.   4. HTN. Blood pressure 19 to 824 mmHG systolic, continue blood pressure monitoring, currently off antihypertensive agents.   DVT prophylaxis: enoxaparin   Code Status: full Family Communication: no family at the bedside  Disposition Plan/ discharge barriers: pending neurology recommendations    Consultants:   Neurology   Procedures:     Antimicrobials:      Subjective: Patient continue to have dis-balance, not back to baseline. Declines all type of injected medications. Willing to try atorvastatin combined with omega 3. No nausea or vomiting, continue to need help for ambulation.   Objective: Vitals:   09/03/18 1927 09/03/18 2352 09/04/18 0338 09/04/18 0830  BP: (!) 120/45 105/63 (!) 119/53 133/68  Pulse: (!) 58 60 (!) 56 68  Resp: 18 20 20 16   Temp: 98.4 F (36.9 C) 98 F (36.7 C) 97.9 F (36.6 C) 98.5 F (36.9 C)  TempSrc: Oral Oral Oral Oral  SpO2: 97% 99% 99% 97%  Weight:      Height:        Intake/Output Summary (Last 24 hours) at 09/04/2018 1149 Last data filed at 09/03/2018 1538 Gross per 24 hour  Intake 344.97 ml  Output -  Net 344.97 ml   Filed Weights   09/03/18 0517  Weight: 66.7 kg    Examination:   General: deconditioned  Neurology: Awake and alert, non focal  E ENT: mild pallor, no icterus, oral mucosa moist Cardiovascular: No JVD. S1-S2 present, rhythmic, no gallops, rubs, or murmurs. No lower  extremity edema. Pulmonary: vesicular breath sounds bilaterally, adequate air movement, no wheezing, rhonchi or rales. Gastrointestinal. Abdomen with no organomegaly, non tender, no rebound or guarding Skin. No rashes Musculoskeletal: no joint deformities     Data Reviewed: I have personally reviewed following labs and imaging  studies  CBC: Recent Labs  Lab 09/02/18 1739  WBC 7.6  NEUTROABS 3.9  HGB 16.3*  HCT 50.1*  MCV 90.4  PLT 353   Basic Metabolic Panel: Recent Labs  Lab 09/02/18 1739  NA 136  K 4.4  CL 102  CO2 26  GLUCOSE 402*  BUN 6*  CREATININE 0.78  CALCIUM 9.2   GFR: Estimated Creatinine Clearance: 63.1 mL/min (by C-G formula based on SCr of 0.78 mg/dL). Liver Function Tests: Recent Labs  Lab 09/02/18 1739  AST 16  ALT 15  ALKPHOS 72  BILITOT 0.6  PROT 7.3  ALBUMIN 3.3*   No results for input(s): LIPASE, AMYLASE in the last 168 hours. No results for input(s): AMMONIA in the last 168 hours. Coagulation Profile: No results for input(s): INR, PROTIME in the last 168 hours. Cardiac Enzymes: No results for input(s): CKTOTAL, CKMB, CKMBINDEX, TROPONINI in the last 168 hours. BNP (last 3 results) No results for input(s): PROBNP in the last 8760 hours. HbA1C: Recent Labs    09/04/18 0518  HGBA1C 11.3*   CBG: Recent Labs  Lab 09/03/18 0611 09/03/18 1207 09/03/18 1714 09/03/18 2123 09/04/18 0628  GLUCAP 283* 313* 279* 137* 198*   Lipid Profile: Recent Labs    09/04/18 0518  CHOL 180  HDL 26*  LDLCALC 124*  TRIG 150*  CHOLHDL 6.9   Thyroid Function Tests: No results for input(s): TSH, T4TOTAL, FREET4, T3FREE, THYROIDAB in the last 72 hours. Anemia Panel: No results for input(s): VITAMINB12, FOLATE, FERRITIN, TIBC, IRON, RETICCTPCT in the last 72 hours.    Radiology Studies: I have reviewed all of the imaging during this hospital visit personally     Scheduled Meds: .  stroke: mapping our early stages of recovery book   Does not apply Once  . aspirin EC  81 mg Oral Daily  . clopidogrel  75 mg Oral Daily  . glipiZIDE  5 mg Oral BID AC   Continuous Infusions:   LOS: 1 day        Mauricio Gerome Apley, MD [facility] Pager [phone]

## 2018-09-04 NOTE — Consult Note (Signed)
Physical Medicine and Rehabilitation Consult Reason for Consult: Stroke Referring Phsyician: Arrien CHERRISH VITALI is an 67 y.o. female.   HPI: 67 year old female with diabetes, hyperlipidemia, and anxiety who was admitted to [facility] 09/02/2018 with a 2-day history of gait imbalance.  Patient with some mild weakness on the right side.  She has been noncompliant with diabetic management.  Non-smoker.  Her initial glucose was 402 in the emergency department.  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  She was outside TPA or intervention window. CT showed left centrum semiovale infarct, CTA showed no significant occlusion of the ICA on the right, left ICA 75% stenosis, right M1 50% stenosis, right distal vertebral 50% stenosis, transthoracic echocardiogram showed left ventricular ejection fraction of 60 to 65%, normal wall thickness, grade 1 diastolic dysfunction, trivial mitral regurg, EKG demonstrated normal sinus rhythm. OT evaluation indicates that [facility] her apartment which has 17 steps to enter.  She is now requiring min assist for ADL lower body only is set up for upper body, toileting is supervision to min guard, PT reports min guard ambulation day 1 after admission, steps were not tested Review of Systems -HEENT negative, cardiac negative chest pain shortness of breath, GI negative nausea vomiting diarrhea constipation, GU negative incontinence or retention or dysuria, musculoskeletal no pain with upper extremity lower extremity movements no neck or back pains, neurologic some clumsiness right hand, mild gait imbalance, remainder of review of systems is negative  Past Medical History:  Diagnosis Date  . ADD (attention deficit disorder)   . ADHD (attention deficit hyperactivity disorder)   . Anxiety   . Diabetes mellitus   . GERD (gastroesophageal reflux disease)   . Hyperlipidemia   . Menopause    Past Surgical History:  Procedure  Laterality Date  . FOOT SURGERY  2012  . [place]  . HAMMERTOE RECONSTRUCTION WITH WEIL OSTEOTOMY  10/27/2012   Procedure: HAMMERTOE RECONSTRUCTION WITH WEIL OSTEOTOMY;  Surgeon: [name], MD;  Location: [place];  Service: Orthopedics;  Laterality: Right;  Right Second, Third and Fouth  Hammertoe Correction; Right Second and Third Metatarsal Weil Osteotomy; Second and Third Dorsal Capsulotomy;  Second Solicitor  . KNEE ARTHROSCOPY  2008 and 2009   both knees  . MOUTH SURGERY  2010  . varicose veins  2011and 1991  . WRIST SURGERY     cyst removal   Family History  Problem Relation Age of Onset  . Heart attack Maternal Grandfather   . Hypertension Maternal Grandfather   . Heart attack Father   . Cancer Mother        skin  . Schizophrenia Mother   . Depression Mother   . Diabetes Mother   . Hypertension Mother   . Cancer Maternal Uncle        skin  . Emphysema Maternal Aunt   . ADD / ADHD Son    Social History:  reports that she quit smoking about 11 years ago. Her smoking use included cigarettes. She has a 315.00 pack-year smoking history. She has never used smokeless tobacco. She reports that she does not drink alcohol or use drugs. Allergies:  Allergies  Allergen Reactions  . Atorvastatin Other (See Comments)    Muscles hurt  . Statins Other (See Comments)    Muscle aches   Facility-Administered Medications Prior to Admission  Medication Dose Route Frequency Provider Last Rate Last Dose  . insulin aspart (novoLOG) injection  10 Units  10 Units Subcutaneous Once Lance Bosch, NP       Medications Prior to Admission  Medication Sig Dispense Refill  . Cholecalciferol (VITAMIN D-3) 1000 units CAPS Take 2,000 Units by mouth daily.    . diphenhydrAMINE HCl, Sleep, (ZZZQUIL) 25 MG CAPS Take 50 mg by mouth at bedtime as needed (for sleep).    Marland Kitchen MAGNESIUM PO Take 2 tablets by mouth daily.    . Multiple Vitamin (MULTIVITAMIN)  tablet Take 1 tablet by mouth daily.    . Naproxen Sod-diphenhydrAMINE (ALEVE PM) 220-25 MG TABS Take 1-2 tablets by mouth at bedtime as needed (for nighttime pain in the legs and/or hands).    Marland Kitchen POTASSIUM PO Take 1 tablet by mouth daily.    . Prenatal Vit-Fe Fumarate-FA (PRENATAL COMPLETE PO) Take 1 tablet by mouth daily.    . Blood Glucose Monitoring Suppl (TRUE METRIX METER) W/DEVICE KIT Use as directed! 1 kit 0  . furosemide (LASIX) 20 MG tablet One tablet po for 2 days, then 1/2 tablet q d prn leg edema. (Patient not taking: Reported on 09/02/2018) 20 tablet Locating the  . gabapentin (NEURONTIN) 300 MG capsule Take 1 capsule (300 mg total) by mouth 3 (three) times daily. (Patient not taking: Reported on 09/02/2018) 90 capsule 2  . glipiZIDE (GLUCOTROL) 5 MG tablet Take 1 tablet (5 mg total) by mouth 2 (two) times daily before a meal. (Patient not taking: Reported on 09/02/2018) 60 tablet 4  . glucose blood (ACCU-CHEK AVIVA) test strip Use as instructed 100 each 12  . glucose blood (TRUE METRIX BLOOD GLUCOSE TEST) test strip 1 each by Other route 3 (three) times daily. Use as instructed 100 each 12  . lisinopril (ZESTRIL) 2.5 MG tablet Take 1 tablet (2.5 mg total) by mouth daily. (Patient not taking: Reported on 09/02/2018) 30 tablet 4  . naproxen (NAPROSYN) 500 MG tablet Take 1 tablet (500 mg total) by mouth 2 (two) times daily. (Patient not taking: Reported on 09/02/2018) 20 tablet 0  . omeprazole (PRILOSEC) 20 MG capsule Take 1 capsule (20 mg total) by mouth daily before breakfast. (Patient not taking: Reported on 09/02/2018) 30 capsule 4  . potassium chloride (K-DUR) 10 MEQ tablet Take 2 tablets per day for 3 days, then 1 q d while taking Lasix (Patient not taking: Reported on 09/02/2018) 24 tablet 0  . pseudoephedrine (SUDAFED 12 HOUR) 120 MG 12 hr tablet Take 1 tablet (120 mg total) by mouth 2 (two) times daily. (Patient not taking: Reported on 09/02/2018) 14 tablet 0  . terbinafine  (LAMISIL) 250 MG tablet Take 1 tablet (250 mg total) by mouth daily. (Patient not taking: Reported on 09/02/2018) 30 tablet 0  . TRUEPLUS LANCETS 28G MISC Use TID 100 each 11  . Vitamin D, Ergocalciferol, (DRISDOL) 50000 UNITS CAPS capsule Take 1 capsule (50,000 Units total) by mouth every 7 (seven) days. (Patient not taking: Reported on 09/02/2018) 12 capsule 0    Home: Home Living Family/patient expects to be discharged to:: Private residence Living Arrangements: Alone Available Help at Discharge: Friend(s), Available PRN/intermittently Type of Home: Apartment Home Access: Stairs to enter [employer] of Steps: 17 Entrance Stairs-Rails: Right, Left Home Layout: One level Bathroom Shower/Tub: Chiropodist: Standard Bathroom Accessibility: Yes Home Equipment: None  Functional History: Prior Function Comments: works; drives; recently moved into a government subsidized apt from the [employer]  Functional Status:  Mobility:     Ambulation/Gait Gait Distance (Feet): 100  Feet Gait velocity: decreased General Gait Details: pt with modest instability with ambulation without using an AD, occasional min A required for balance with pt having minor LOBs laterally when ambulating in hallway and around obstacles Number of Stairs: 2(x2 trials)    ADL:    Cognition: Cognition Overall Cognitive Status: Impaired/Different from baseline Orientation Level: Oriented X4 Cognition Arousal/Alertness: Awake/alert Behavior During Therapy: WFL for tasks assessed/performed Overall Cognitive Status: Impaired/Different from baseline Area of Impairment: Safety/judgement, Problem solving, Awareness Safety/Judgement: Decreased awareness of deficits, Decreased awareness of safety Awareness: Emergent Problem Solving: Slow processing, Requires verbal cues General Comments: Difficulty with serial subtraction; lost attention to task  Blood pressure  133/68, pulse 68, temperature 98.5 F (36.9 C), temperature source Oral, resp. rate 16, height _0  (1.651 m), weight 66.7 kg, SpO2 97 %. Physical Exam  General: No acute distress Mood and affect are appropriate Heart: Regular rate and rhythm no rubs murmurs or extra sounds Lungs: Clear to auscultation, breathing unlabored, no rales or wheezes Abdomen: Positive bowel sounds, soft nontender to palpation, nondistended Extremities: No clubbing, cyanosis, or edema Skin: No evidence of breakdown, no evidence of rash Neurologic: Cranial nerves II through XII intact, motor strength is 5/5 in bilateral deltoid, bicep, tricep, grip, hip flexor, knee extensors, ankle dorsiflexor and plantar flexor Sensory exam normal sensation to light touch and proprioception in bilateral upper and lower extremities Cerebellar exam normal finger to nose to finger as well as heel to shin in bilateral upper and lower extremities Disc diadochokinesis with rapid alternating supination pronation of the right upper extremity. Gait is with standby assistance no assistive device Musculoskeletal: Full range of motion in all 4 extremities. No joint swelling Results for orders placed or performed during the hospital encounter of 09/02/18 (from the past 24 hour(s))  Glucose, capillary     Status: Abnormal   Collection Time: 09/03/18 12:07 PM  Result Value Ref Range   Glucose-Capillary 313 (H) 70 - 99 mg/dL  Glucose, capillary     Status: Abnormal   Collection Time: 09/03/18  5:14 PM  Result Value Ref Range   Glucose-Capillary 279 (H) 70 - 99 mg/dL  Glucose, capillary     Status: Abnormal   Collection Time: 09/03/18  9:23 PM  Result Value Ref Range   Glucose-Capillary 137 (H) 70 - 99 mg/dL   Comment 1 Notify RN    Comment 2 Document in Chart   Lipid panel     Status: Abnormal   Collection Time: 09/04/18  5:18 AM  Result Value Ref Range   Cholesterol 180 0 - 200 mg/dL   Triglycerides 150 (H) <150 mg/dL   HDL 26 (L)  >40 mg/dL   Total CHOL/HDL Ratio 6.9 RATIO   VLDL 30 0 - 40 mg/dL   LDL Cholesterol 124 (H) 0 - 99 mg/dL  Hemoglobin A1c     Status: Abnormal   Collection Time: 09/04/18  5:18 AM  Result Value Ref Range   Hgb A1c MFr Bld 11.3 (H) 4.8 - 5.6 %   Mean Plasma Glucose 277.61 mg/dL  Glucose, capillary     Status: Abnormal   Collection Time: 09/04/18  6:28 AM  Result Value Ref Range   Glucose-Capillary 198 (H) 70 - 99 mg/dL   Comment 1 Notify RN    Comment 2 Document in Chart    Ct Angio Head W Or Wo Contrast  Result Date: 09/03/2018 CLINICAL DATA:  66 y.o. female with history of diabetes mellitus, lipidemia intolerant to statin, ADHD,  prior smoker presenting with a 2-day history of gait imbalance and weakness in the right hand. She did not receive IV t-PA due to late presentation. Multiple acute infarcts on MR. Abnormal carotid Dopplers. EXAM: CT ANGIOGRAPHY HEAD AND NECK TECHNIQUE: Multidetector CT imaging of the head and neck was performed using the standard protocol during bolus administration of intravenous contrast. Multiplanar CT image reconstructions and MIPs were obtained to evaluate the vascular anatomy. Carotid stenosis measurements (when applicable) are obtained utilizing NASCET criteria, using the distal internal carotid diameter as the denominator. CONTRAST:  4m ISOVUE-370 IOPAMIDOL (ISOVUE-370) INJECTION 76% COMPARISON:  MR brain 09/03/2018. FINDINGS: CT HEAD FINDINGS Brain: Increasing conspicuity LEFT centrum semiovale infarct, cytotoxic edema. Other smaller infarcts are poorly visualized. Vascular: Reported separately. Skull: Normal. Negative for fracture or focal lesion. Sinuses: Imaged portions are clear. Orbits: No acute finding. Review of the MIP images confirms the above findings CTA NECK FINDINGS Aortic arch: Standard branching. Imaged portion shows no evidence of aneurysm or dissection. No significant stenosis of the major arch vessel origins. Right carotid system: No evidence  of dissection, stenosis (50% or greater) or occlusion. Calcified and noncalcified plaque at the bifurcation. Left carotid system: No evidence of dissection, stenosis (50% or greater) or occlusion. Noncalcified plaque at the bifurcation. Vertebral arteries: Essentially codominant. No evidence of dissection, stenosis (50% or greater) or occlusion. Skeleton: Spondylosis.  Poor dentition. Other neck: No masses. Airway patent. Subcentimeter calcified RIGHT thyroid lesion. Upper chest: No mass or pneumothorax. Review of the MIP images confirms the above findings CTA HEAD FINDINGS Anterior circulation: No flow-limiting stenosis, proximal occlusion, aneurysm, or vascular malformation of the RIGHT ICA. In the cavernous segment of the LEFT ICA there is high-grade stenosis, flow reducing, estimated 75%. See series 12, image 80 series 13, image 121, series 11, image 111. Remainder of the LEFT ICA widely patent. Mild narrowing distal RIGHT M1 MCA, estimated 50%. No similar narrowing on the LEFT. No M2 or M3 branch occlusion. Posterior circulation: No significant stenosis, proximal occlusion, aneurysm, or vascular malformation. LEFT vertebral dominant contributor. 50% stenosis distal RIGHT vertebral prior to basilar confluence. No PCA stenosis or cerebellar branch occlusion. Venous sinuses: As permitted by contrast timing, patent. Anatomic variants: None of significance Delayed phase: No abnormal intracranial enhancement. Review of the MIP images confirms the above findings IMPRESSION: No flow-limiting extracranial stenosis is observed. Mild nonstenotic irregularity of both carotid bifurcations. Flow-limiting stenosis of the cavernous segment LEFT ICA, estimated 75%. Otherwise no intracranial anterior circulation stenoses or dissection. No abnormal postcontrast enhancement. Electronically Signed   By: JStaci RighterM.D.   On: 09/03/2018 18:10   Ct Angio Neck W Or Wo Contrast  Result Date: 09/03/2018 CLINICAL DATA:  67y.o.  female with history of diabetes mellitus, lipidemia intolerant to statin, ADHD, prior smoker presenting with a 2-day history of gait imbalance and weakness in the right hand. She did not receive IV t-PA due to late presentation. Multiple acute infarcts on MR. Abnormal carotid Dopplers. EXAM: CT ANGIOGRAPHY HEAD AND NECK TECHNIQUE: Multidetector CT imaging of the head and neck was performed using the standard protocol during bolus administration of intravenous contrast. Multiplanar CT image reconstructions and MIPs were obtained to evaluate the vascular anatomy. Carotid stenosis measurements (when applicable) are obtained utilizing NASCET criteria, using the distal internal carotid diameter as the denominator. CONTRAST:  560mISOVUE-370 IOPAMIDOL (ISOVUE-370) INJECTION 76% COMPARISON:  MR brain 09/03/2018. FINDINGS: CT HEAD FINDINGS Brain: Increasing conspicuity LEFT centrum semiovale infarct, cytotoxic edema. Other smaller infarcts are poorly  visualized. Vascular: Reported separately. Skull: Normal. Negative for fracture or focal lesion. Sinuses: Imaged portions are clear. Orbits: No acute finding. Review of the MIP images confirms the above findings CTA NECK FINDINGS Aortic arch: Standard branching. Imaged portion shows no evidence of aneurysm or dissection. No significant stenosis of the major arch vessel origins. Right carotid system: No evidence of dissection, stenosis (50% or greater) or occlusion. Calcified and noncalcified plaque at the bifurcation. Left carotid system: No evidence of dissection, stenosis (50% or greater) or occlusion. Noncalcified plaque at the bifurcation. Vertebral arteries: Essentially codominant. No evidence of dissection, stenosis (50% or greater) or occlusion. Skeleton: Spondylosis.  Poor dentition. Other neck: No masses. Airway patent. Subcentimeter calcified RIGHT thyroid lesion. Upper chest: No mass or pneumothorax. Review of the MIP images confirms the above findings CTA HEAD  FINDINGS Anterior circulation: No flow-limiting stenosis, proximal occlusion, aneurysm, or vascular malformation of the RIGHT ICA. In the cavernous segment of the LEFT ICA there is high-grade stenosis, flow reducing, estimated 75%. See series 12, image 80 series 13, image 121, series 11, image 111. Remainder of the LEFT ICA widely patent. Mild narrowing distal RIGHT M1 MCA, estimated 50%. No similar narrowing on the LEFT. No M2 or M3 branch occlusion. Posterior circulation: No significant stenosis, proximal occlusion, aneurysm, or vascular malformation. LEFT vertebral dominant contributor. 50% stenosis distal RIGHT vertebral prior to basilar confluence. No PCA stenosis or cerebellar branch occlusion. Venous sinuses: As permitted by contrast timing, patent. Anatomic variants: None of significance Delayed phase: No abnormal intracranial enhancement. Review of the MIP images confirms the above findings IMPRESSION: No flow-limiting extracranial stenosis is observed. Mild nonstenotic irregularity of both carotid bifurcations. Flow-limiting stenosis of the cavernous segment LEFT ICA, estimated 75%. Otherwise no intracranial anterior circulation stenoses or dissection. No abnormal postcontrast enhancement. Electronically Signed   By: Staci Righter M.D.   On: 09/03/2018 18:10   Mr Brain Wo Contrast  Result Date: 09/03/2018 CLINICAL DATA:  Balance problems EXAM: MRI HEAD WITHOUT CONTRAST TECHNIQUE: Multiplanar, multiecho pulse sequences of the brain and surrounding structures were obtained without intravenous contrast. COMPARISON:  None. FINDINGS: BRAIN: There are multiple foci of abnormal diffusion restriction within the left frontal and parietal lobes. The largest measures approximately 8 mm and is located in the corona radiata. There are multiple punctate subcortical foci that measure up to a 3-4 mm. The midline structures are normal. No midline shift or other mass effect. There are no old infarcts. The white matter  signal is normal for the patient's age. The cerebral and cerebellar volume are age-appropriate. Susceptibility-sensitive sequences show no chronic microhemorrhage or superficial siderosis. VASCULAR: Major intracranial arterial and venous sinus flow voids are normal. SKULL AND UPPER CERVICAL SPINE: Calvarial bone marrow signal is normal. There is no skull base mass. Visualized upper cervical spine and soft tissues are normal. SINUSES/ORBITS: No fluid levels or advanced mucosal thickening. No mastoid or middle ear effusion. The orbits are normal. IMPRESSION: 1. Multiple small ischemic infarcts the left hemisphere, the largest of which measures 8 mm and is located in the corona radiata. 2. No hemorrhage or mass effect. 3. Otherwise normal brain for age. Electronically Signed   By: Ulyses Jarred M.D.   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 Does the need for close, 24 hr/day medical supervision in concert with the patient's rehab needs make it unreasonable for this patient to be served in a less intensive  setting? No 2. Co-Morbidities requiring supervision/potential complications: Diabetes 3. Due to bladder management, bowel management, safety, skin/wound care, disease management, medication administration, pain management and patient education, does the patient require 24 hr/day rehab nursing? No 4. Does the patient require coordinated care of a physician, rehab nurse, PT, OT to address physical and functional deficits in the context of the above medical diagnosis(es)? No Addressing deficits in the following areas: balance, endurance, locomotion, strength, transferring and toileting 5. Can the patient actively participate in an intensive therapy program of at least 3 hrs of therapy per day at least 5 days per week? Yes 6. The potential for patient to make measurable gains while on inpatient rehab is Limited  due to high level of physical functioning at current time 7. Anticipated functional outcomes upon discharge from inpatients are NA PT, NA OT, NA SLP 8. Estimated rehab length of stay to reach the above functional goals is: NA 9. Does the patient have adequate social supports to accommodate these discharge functional goals? Potentially 10. Anticipated D/C setting: Home 11. Anticipated post D/C treatments: [facility] therapy 12. Overall Rehab/Functional Prognosis: excellent  RECOMMENDATIONS: This patient's condition is appropriate for continued rehabilitative care in the following setting: [facility] Therapy Patient has agreed to participate in recommended program. Yes Note that insurance prior authorization may be required for reimbursement for recommended care.  Comment: Would recommend that acute care PT works with patient going up and down steps in stairwell, patient should be able to ambulate with a cane discussed with patient not to return to work for at least 2 weeks post discharge   Charlett Blake 09/04/2018

## 2018-09-04 NOTE — Plan of Care (Signed)
Patient stable, discussed POC with patient and daughter, agreeable with plan, denies question/concerns at this time.  

## 2018-09-04 NOTE — Evaluation (Signed)
Speech Language Pathology Evaluation Patient Details Name: Wendy Underwood MRN: [id number] DOB: 10-Aug-1951 Today's Date: 09/04/2018 Time: 1450-1510 SLP Time Calculation (min) (ACUTE ONLY): 20 min  Problem List:  Patient Active Problem List   Diagnosis Date Noted  . 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 Dyslipidemia 09/03/2018  . Essential hypertension 09/03/2018  . Onychomycosis 07/09/2015  . Neuropathy 06/06/2015  . Diabetes 1.5, managed as type 2 ([place]) 07/07/2012  . Personal history of DVT (deep vein thrombosis) 07/04/2012  . Anxiety 06/01/2012  . GERD (gastroesophageal reflux disease) 06/01/2012  . Other and unspecified hyperlipidemia 06/01/2012  . Menopause 06/01/2012  . Varicose veins 06/01/2012  . DJD (degenerative joint disease) of knee 06/01/2012   Past Medical History:  Past Medical History:  Diagnosis Date  . ADD (attention deficit disorder)   . ADHD (attention deficit hyperactivity disorder)   . Anxiety   . Diabetes mellitus   . GERD (gastroesophageal reflux disease)   . Hyperlipidemia   . Menopause    Past Surgical History:  Past Surgical History:  Procedure Laterality Date  . FOOT SURGERY  2012  . [place]  . HAMMERTOE RECONSTRUCTION WITH WEIL OSTEOTOMY  10/27/2012   Procedure: HAMMERTOE RECONSTRUCTION WITH WEIL OSTEOTOMY;  Surgeon: [name], MD;  Location: [facility];  Service: Orthopedics;  Laterality: Right;  Right Second, Third and Fouth  Hammertoe Correction; Right Second and Third Metatarsal Weil Osteotomy; Second and Third Dorsal Capsulotomy;  Second Solicitor  . KNEE ARTHROSCOPY  2008 and 2009   both knees  . MOUTH SURGERY  2010  . varicose veins  2011and 1991  . WRIST SURGERY     cyst removal   HPI:  MRI head - small ischemic infarcts the left corona radiata and semiovale.    Assessment / Plan / Recommendation Clinical Impression   Patient presents with mild cognitive communication  impairment with deficits noted today in higher level attention, problem solving, intellectual awareness, and recall. Pt recalled 2/5 words with a delay, and cues required for functional recall of recommendations from OT/PT and MD. [facility] Basic score was 24/30 (26 and above is WNL). She is somewhat impulsive, though I suspect some of this may be her baseline. Pt reports she has ADHD; SLP explained cognitive changes with CVA may impact her ability to compensate. Pt motivated to return to work and prior activities. Recommend SLP follow-up ([place] SLP) in next level of care to maximize safety awareness and cognitive function for return to work.     SLP Assessment  SLP Recommendation/Assessment: All further Speech Lanaguage Pathology  needs can be addressed in the next venue of care SLP Visit Diagnosis: Cognitive communication deficit (R41.841)    Follow Up Recommendations  Home health SLP    Frequency and Duration           SLP Evaluation Cognition  Overall Cognitive Status: Impaired/Different from baseline Arousal/Alertness: Awake/alert Orientation Level: Oriented X4 Attention: Sustained;Selective;Alternating Sustained Attention: Appears intact Selective Attention: Appears intact Alternating Attention: Impaired Alternating Attention Impairment: Verbal basic;Functional basic Memory: Impaired Memory Impairment: Decreased recall of new information(delayed recall 2/5) Decreased Short Term Memory: Verbal basic Awareness: Impaired Awareness Impairment: Intellectual impairment;Emergent impairment;Anticipatory impairment Problem Solving: Impaired Problem Solving Impairment: Verbal basic Executive Function: Self Monitoring;Self Correcting(clock drawing WFL) Self Monitoring: Impaired Self Monitoring Impairment: Verbal basic Self Correcting: Impaired Self Correcting Impairment: Verbal basic Behaviors: Impulsive Safety/Judgment: Impaired Comments: decreased awareness of deficits        Comprehension  Auditory Comprehension Overall  Auditory Comprehension: Appears within functional limits for tasks assessed Yes/No Questions: Within Functional Limits Commands: Within Functional Limits Conversation: Simple Interfering Components: Attention Visual Recognition/Discrimination Discrimination: Within Function Limits Reading Comprehension Reading Status: Not tested    Expression Expression Primary Mode of Expression: Verbal Verbal Expression Overall Verbal Expression: Appears within functional limits for tasks assessed Initiation: No impairment Automatic Speech: Name;Social Response Level of Generative/Spontaneous Verbalization: Conversation Repetition: No impairment Naming: No impairment Pragmatics: No impairment Written Expression Dominant Hand: Right Written Expression: Not tested   Oral / Motor  Oral Motor/Sensory Function Overall Oral Motor/Sensory Function: Within functional limits Motor Speech Overall Motor Speech: Appears within functional limits for tasks assessed Respiration: Within functional limits Phonation: Normal Resonance: Within functional limits Articulation: Within functional limitis Intelligibility: Intelligible Motor Planning: Witnin functional limits Motor Speech Errors: Not applicable   GO                   Deneise Lever, [place], [place] Pager: [phone] Office: [phone]  Aliene Altes 09/04/2018, 3:20 PM

## 2018-09-05 ENCOUNTER — Other Ambulatory Visit (HOSPITAL_COMMUNITY): Payer: Self-pay | Admitting: Interventional Radiology

## 2018-09-05 DIAGNOSIS — I771 Stricture of artery: Secondary | ICD-10-CM

## 2018-09-05 DIAGNOSIS — I639 Cerebral infarction, unspecified: Secondary | ICD-10-CM

## 2018-09-05 LAB — GLUCOSE, CAPILLARY
GLUCOSE-CAPILLARY: 154 mg/dL — AB (ref 70–99)
Glucose-Capillary: 188 mg/dL — ABNORMAL HIGH (ref 70–99)
Glucose-Capillary: 251 mg/dL — ABNORMAL HIGH (ref 70–99)

## 2018-09-05 MED ORDER — CLOPIDOGREL BISULFATE 75 MG PO TABS: 75.0000 mg | ORAL_TABLET | Freq: Every day | ORAL | 0 refills | Status: DC

## 2018-09-05 MED ORDER — ASPIRIN 81 MG PO TBEC: 81.0000 mg | DELAYED_RELEASE_TABLET | Freq: Every day | ORAL | 0 refills | Status: DC

## 2018-09-05 MED ORDER — OMEGA-3-ACID ETHYL ESTERS 1 G PO CAPS: 1.0000 g | ORAL_CAPSULE | Freq: Every day | ORAL | 0 refills | Status: AC

## 2018-09-05 MED ORDER — ATORVASTATIN CALCIUM 20 MG PO TABS: 20.0000 mg | ORAL_TABLET | Freq: Every day | ORAL | 0 refills | Status: DC

## 2018-09-05 MED ORDER — GLIPIZIDE 5 MG PO TABS: 5.0000 mg | ORAL_TABLET | Freq: Two times a day (BID) | ORAL | 0 refills | Status: DC

## 2018-09-05 NOTE — Progress Notes (Signed)
CSW acknowledging consult for patient going home with home health. Inappropriate consult; RNCM arranges home health services. RNCM aware.  CSW signing off.  Laveda Abbe, [place] Clinical Social Worker [phone]

## 2018-09-05 NOTE — Plan of Care (Signed)
MIN ASSIST TO INDEPENDENT WITH ADLS

## 2018-09-05 NOTE — Progress Notes (Addendum)
Inpatient Diabetes Program Recommendations  AACE/ADA: New Consensus Statement on Inpatient Glycemic Control (2015)  Target Ranges:  Prepandial:   less than 140 mg/dL      Peak postprandial:   less than 180 mg/dL (1-2 hours)      Critically ill patients:  140 - 180 mg/dL   Lab Results  Component Value Date   GLUCAP 154 (H) 09/05/2018   HGBA1C 11.3 (H) 09/04/2018    Went to go discuss diabetes care with patient and she had just been discharged. Spoke to Case Manager who had made patient a f/u with a PCP (did not have a PCP prior to hospitalization). Per notes she was not seeing an MD/not taking her diabetes meds/not checking her CBG at home. Note Hbg A1c had been 7% (2013) and 9% (2016) and now increased to 11.3%. As patient was d/c home was unable to speak to her regarding above but spoke to her bedside RN who stated she was aware of the increased A1c and was planning to take steps to improve. Left general message on patient's personal voicemail emphasizing importance to following up with her new PCP to manage her diabetes control.  -- Will follow during hospitalization.--  Jonna Clark RN, MSN Diabetes Coordinator Inpatient Glycemic Control Team Team Pager: [phone] (8am-5pm)

## 2018-09-05 NOTE — Progress Notes (Signed)
Physical Therapy Treatment Patient Details Name: Wendy Underwood MRN: [id number] DOB: 1951-05-13 Today's Date: 09/05/2018    History of Present Illness Pt is a 67 y/o female admitted secondary to gait instability. MRI revealed multiple small ischemic infarcts in the L hemisphere, the largest of which measures 8 mm and is located in the corona radiata. PMH including but not limited to DM, HLD and ADHD.    PT Comments    Patient seen for activity progression, plan now for d/c home. Educated on safety with mobility. Recommend HHPT upon acute discharge.   Follow Up Recommendations  Home health PT;Supervision - Intermittent     Equipment Recommendations  Cane    Recommendations for Other Services       Precautions / Restrictions Precautions Precautions: Fall    Mobility  Bed Mobility Overal bed mobility: Modified Independent                Transfers Overall transfer level: Modified independent Equipment used: None                Ambulation/Gait Ambulation/Gait assistance: Supervision;Min guard Gait Distance (Feet): 160 Feet Assistive device: Straight cane Gait Pattern/deviations: Step-through pattern;Decreased stride length;Drifts right/left Gait velocity: decreased   General Gait Details: modest instability with noted lateral balance checks but able to self correct(CVs for increased cadence)   Stairs Stairs: Yes Stairs assistance: Min guard Stair Management: Two rails Number of Stairs: 5 General stair comments: performed x2   Wheelchair Mobility    Modified Rankin (Stroke Patients Only) Modified Rankin (Stroke Patients Only) Pre-Morbid Rankin Score: No symptoms Modified Rankin: Moderate disability     Balance Overall balance assessment: Needs assistance   Sitting balance-Leahy Scale: Good       Standing balance-Leahy Scale: Fair Standing balance comment: furniturre walking                            Cognition  Arousal/Alertness: Awake/alert Behavior During Therapy: WFL for tasks assessed/performed;Impulsive Overall Cognitive Status: No family/caregiver present to determine baseline cognitive functioning                                 General Comments: impulsive with decreased safety awarenss at times during session      Exercises Other Exercises Other Exercises: educated on balance program and safety expectations with mobility Other Exercises: advised to limited over stimulation Other Exercises: cues for safety with assistive device    General Comments        Pertinent Vitals/Pain Pain Assessment: No/denies pain    Home Living                      Prior Function            PT Goals (current goals can now be found in the care plan section) Acute Rehab PT Goals Patient Stated Goal: return to PLOF and work PT Goal Formulation: With patient Time For Goal Achievement: 09/17/18 Potential to Achieve Goals: Good Progress towards PT goals: Progressing toward goals    Frequency    Min 4X/week      PT Plan Discharge plan needs to be updated    Co-evaluation              AM-PAC PT "6 Clicks" Daily Activity  Outcome Measure  Difficulty turning over in bed (including adjusting bedclothes, sheets and blankets)?: None Difficulty  moving from lying on back to sitting on the side of the bed? : None Difficulty sitting down on and standing up from a chair with arms (e.g., wheelchair, bedside commode, etc,.)?: A Little Help needed moving to and from a bed to chair (including a wheelchair)?: None Help needed walking in hospital room?: A Little Help needed climbing 3-5 steps with a railing? : A Little 6 Click Score: 21    End of Session Equipment Utilized During Treatment: Gait belt Activity Tolerance: Patient tolerated treatment well Patient left: with call bell/phone within reach Nurse Communication: Mobility status PT Visit Diagnosis: Unsteadiness on  feet (R26.81);Difficulty in walking, not elsewhere classified (R26.2)     Time: 0100-7121 PT Time Calculation (min) (ACUTE ONLY): 15 min  Charges:  $Gait Training: 8-22 mins                     Alben Deeds, PT DPT  Board Certified Neurologic Specialist [place] Pager [phone] Office [place] 09/05/2018, 12:31 PM

## 2018-09-05 NOTE — Care Management Note (Addendum)
Case Management Note  Patient Details  Name: Wendy Underwood MRN: [id number] Date of Birth: 1951-11-10  Subjective/Objective:    Pt admitted with stroke. She is from home alone. Pt denies any DME. She denies issues with medication or transportation.                Action/Plan: Recommendations were for CIR. CIR MD feels patient can d/c home with [facility] services. CM provided the patient choice and she selected [facility]. Butch Penny with [facility] notified and accepted the referral.  Pt with orders for a cane and 3 in 1. James with [facility] DME notified and will deliver to the room. Pt has transportation home.   1245 pm: received information from Butch Penny at [facility] that patient does NOT have [place] benefits through her insurance. CM spoke to patient and she would like to see if she has Outpatient therapy benefits. CM notified the MD and he was in agreement.  Orders placed in Epic and information on her AVS.   Expected Discharge Date:  09/05/18               Expected Discharge Plan:  [place]  In-House Referral:     Discharge planning Services  CM Consult  Post Acute Care Choice:  Durable Medical Equipment, Home Health Choice offered to:  Patient  DME Arranged:  3-N-1, Kasandra Knudsen DME Agency:  [place] Arranged:  PT, OT, Speech Therapy [place] Agency:  [place]  Status of Service:  Completed, signed off  If discussed at [place] of Stay Meetings, dates discussed:    Additional Comments:  Pollie Friar, RN 09/05/2018, 10:41 AM

## 2018-09-05 NOTE — Progress Notes (Signed)
Occupational Therapy Treatment Patient Details Name: Wendy Underwood MRN: [id number] DOB: 04/25/1951 Today's Date: 09/05/2018    History of present illness Pt is a 67 y/o female admitted secondary to gait instability. MRI revealed multiple small ischemic infarcts in the L hemisphere, the largest of which measures 8 mm and is located in the corona radiata. PMH including but not limited to DM, HLD and ADHD.   OT comments  Pt making good progress. Focus of session on ADL retraining and establishing HEP. Recommend follow up [place].   Follow Up Recommendations  Home health OT    Equipment Recommendations  3 in 1 bedside commode(to use as shower seat)    Recommendations for Other Services      Precautions / Restrictions Precautions Precautions: Fall Restrictions Weight Bearing Restrictions: No       Mobility Bed Mobility Overal bed mobility: Modified Independent                Transfers Overall transfer level: Modified independent                    Balance Overall balance assessment: Needs assistance   Sitting balance-Leahy Scale: Good       Standing balance-Leahy Scale: Fair Standing balance comment: furniturre walking                           ADL either performed or assessed with clinical judgement   ADL                                       Functional mobility during ADLs: Supervision/safety General ADL Comments: completed ADL task with S; educated obn home saety adn reducing risk of falls; recommend pt use 3in1 as shower seat; pt demosntrated understanding     Vision       Perception     Praxis      Cognition Arousal/Alertness: Awake/alert Behavior During Therapy: WFL for tasks assessed/performed Overall Cognitive Status: No family/caregiver present to determine baseline cognitive functioning                                 General Comments: Appeasr impulsive at times; slow processing with  calculations        Exercises Exercises: Other exercises Other Exercises Other Exercises: fine motor coordination handout Other Exercises: theraputty  - level 2 with handout Other Exercises: BUE integrated activities   Shoulder Instructions       General Comments      Pertinent Vitals/ Pain       Pain Assessment: No/denies pain  Home Living                                          Prior Functioning/Environment              Frequency  Min 3X/week        Progress Toward Goals  OT Goals(current goals can now be found in the care plan section)  Progress towards OT goals: Progressing toward goals  Acute Rehab OT Goals Patient Stated Goal: return to PLOF and work OT Goal Formulation: With patient Time For Goal Achievement: 09/17/18 Potential to Achieve Goals: Good ADL Goals Pt Will  Perform Upper Body Bathing: with modified independence;standing Pt Will Perform Lower Body Bathing: with modified independence;sit to/from stand Pt Will Perform Upper Body Dressing: with modified independence;sitting Pt Will Perform Lower Body Dressing: with modified independence;sit to/from stand Pt Will Transfer to Toilet: with modified independence;ambulating Pt Will Perform Toileting - Clothing Manipulation and hygiene: Independently Pt Will Perform Tub/Shower Transfer: with modified independence;3 in 1;Tub transfer  Plan Discharge plan needs to be updated    Co-evaluation                 AM-PAC PT "6 Clicks" Daily Activity     Outcome Measure   Help from another person eating meals?: None Help from another person taking care of personal grooming?: None Help from another person toileting, which includes using toliet, bedpan, or urinal?: None Help from another person bathing (including washing, rinsing, drying)?: None Help from another person to put on and taking off regular upper body clothing?: None Help from another person to put on and taking off  regular lower body clothing?: None 6 Click Score: 24    End of Session    OT Visit Diagnosis: Other abnormalities of gait and mobility (R26.89);Muscle weakness (generalized) (M62.81);Other symptoms and signs involving cognitive function   Activity Tolerance Patient tolerated treatment well   Patient Left with call bell/phone within reach;in chair   Nurse Communication Mobility status        Time: 1000-1041 OT Time Calculation (min): 41 min  Charges: OT General Charges $OT Visit: 1 Visit OT Treatments $Self Care/Home Management : 8-22 mins $Therapeutic Activity: 8-22 mins $Neuromuscular Re-education: 8-22 mins  Maurie Boettcher, OT/L   Acute OT Clinical Specialist Acute Rehabilitation Services Pager [phone] Office [phone]    [facility] 09/05/2018, 11:28 AM

## 2018-09-05 NOTE — Discharge Summary (Addendum)
Physician Discharge Summary  LONDEN LORGE WIO:[id number] DOB: 01-30-1951 DOA: 09/02/2018  PCP: Patient, No Pcp Per  Admit date: 09/02/2018 Discharge date: 09/05/2018  Admitted From: Home  Disposition:  Home   Recommendations for Outpatient Follow-up and new medication changes:  1. Follow up with Primary Care in 7 days.  2. Patient will take dual antiplatelet therapy with asa and clopidogrel for 3 months the continue aspirin alone.  3. Follow up with Dr. Estanislado Pandy for stenting, symptomatic left ICA stenosis causing eboli, October 17 at 13:00.  4. Patient has been placed on low dose atorvastatin, had muscle cramps in the past, will need close monitoring on medication tolerance.  5. She was advised not to return to work for at least 2 weeks post discharge.  Home Health: Yes   Equipment/Devices: cane   Discharge Condition: stable  CODE STATUS: full  Diet recommendation: heart healthy and diabetic prudent.   Brief/Interim Summary: 67 year old female who presented with ambulatory dysfunction. She does have the significant past medical history for type 2 diabetes mellitus, dyslipidemia and anxiety. Reported 3 days of ambulatory dysfunction, decreased balance,with no other neurologic focal deficits. On her initial physical examination herbloodpressure was 125/52, heart rate 63, oxygen saturation 97%.Moist mucous membranes, lungs clear to auscultation bilaterally, heart S1-S2 present and rhythmic, abdomen soft nontender, no lower extremity edema. Left-sided weakness 4 out of 5.Sodium 136, potassium 4.4, chloride 102, bicarb 26, glucose 402, BUN 6, creatinine 0.78, white count 7.6, hemoglobin 16.3, hematocrit 50.1 platelets 312, urinalysis greater than 500 glucose, specific gravity 1.028, 11-20 white cells, 0-5 red cells, drug screen negative.EKG normal sinus rhythm, normal axis, normal intervals. Brain MRI with multiple small ischemic infarcts in the left hemisphere, the largest of  which measures 8 mm located in the corona radiata. No hemorrhage or mass-effect.  Patient was admitted to the hospital with the working diagnosis of left hemispheric ischemic cerebrovascular accident.  1. Left hemispheric infarcts, due to left ICA stenosis at the cavernous portion. Patient was admitted to the medical ward, she was placed on a remote telemetry monitor, she had frequent neuro checks, physical therapy, speech therapy and neurology evaluation.  Further work-up with CT angiography of the head and neck showed left ICA 70% stenosis, at the cavernous segment.  Her carotid ultrasonography was 40 to 59% ICA stenosis bilaterally, echocardiography showed LV systolic function 60 to 42% with no thrombosis.  Her LDL was 124.  Patient was placed on dual antiplatelet therapy with aspirin and clopidogrel with instructions to take for 3 months then continue aspirin alone.  Patient had muscle cramps with statins in the past, atorvastatin was resumed at a lower dose of 20 mg daily with a close follow-up as an outpatient.  Patient will continue physical therapy at home along with speech therapy.  Follow-up appointment with,Dr. Estanislado Pandy, interventional radiology for left ICA stenosis.  Patient was seen by the inpatient rehab, recommendations to continue therapy as an outpatient with home health, she was told not to return to work for at least 2 weeks post discharge.  2.  Dyslipidemia.  LDL 124, HDL 26, triglycerides 150, patient has been placed on atorvastatin 20 g daily.  Patient had muscle cramps in the past with statins, will do a second trial of low-dose, close outpatient follow-up monitoring.  3.  Uncontrolled type 2 diabetes mellitus.  She was placed on glipizide 5 g twice daily with improvement of capillary glucose.  Patient had severe GI symptoms with metformin in the past.  4.  Hypertension.  Blood pressure remained well controlled off antihypertensive agents, continue follow-up as an  outpatient.  Discharge Diagnoses:  Active Problems:   Diabetes 1.5, managed as type 2 ([place])   Stroke (cerebrum) ([place])   Dyslipidemia   Essential hypertension    Discharge Instructions   Allergies as of 09/05/2018      Reactions   Atorvastatin Other (See Comments)   Muscles hurt   Statins Other (See Comments)   Muscle aches      Medication List    STOP taking these medications   furosemide 20 MG tablet Commonly known as:  LASIX   gabapentin 300 MG capsule Commonly known as:  NEURONTIN   lisinopril 2.5 MG tablet Commonly known as:  PRINIVIL,ZESTRIL   naproxen 500 MG tablet Commonly known as:  NAPROSYN   omeprazole 20 MG capsule Commonly known as:  PRILOSEC   potassium chloride 10 MEQ tablet Commonly known as:  K-DUR   pseudoephedrine 120 MG 12 hr tablet Commonly known as:  SUDAFED   terbinafine 250 MG tablet Commonly known as:  LAMISIL   Vitamin D (Ergocalciferol) 50000 units Caps capsule Commonly known as:  DRISDOL     TAKE these medications   ALEVE PM 220-25 MG Tabs Generic drug:  Naproxen Sod-diphenhydrAMINE Take 1-2 tablets by mouth at bedtime as needed (for nighttime pain in the legs and/or hands).   aspirin 81 MG EC tablet Take 1 tablet (81 mg total) by mouth daily.   atorvastatin 20 MG tablet Commonly known as:  LIPITOR Take 1 tablet (20 mg total) by mouth daily at 6 PM.   clopidogrel 75 MG tablet Commonly known as:  PLAVIX Take 1 tablet (75 mg total) by mouth daily.   glipiZIDE 5 MG tablet Commonly known as:  GLUCOTROL Take 1 tablet (5 mg total) by mouth 2 (two) times daily before a meal.   glucose blood test strip Use as instructed   glucose blood test strip 1 each by Other route 3 (three) times daily. Use as instructed   MAGNESIUM PO Take 2 tablets by mouth daily.   multivitamin tablet Take 1 tablet by mouth daily.   omega-3 acid ethyl esters 1 g capsule Commonly known as:  LOVAZA Take 1 capsule (1 g total) by mouth daily  at 6 PM.   POTASSIUM PO Take 1 tablet by mouth daily.   PRENATAL COMPLETE PO Take 1 tablet by mouth daily.   TRUE METRIX METER w/Device Kit Use as directed!   TRUEPLUS LANCETS 28G Misc Use TID   Vitamin D-3 1000 units Caps Take 2,000 Units by mouth daily.   ZZZQUIL 25 MG Caps Generic drug:  diphenhydrAMINE HCl (Sleep) Take 50 mg by mouth at bedtime as needed (for sleep).       Allergies  Allergen Reactions  . Atorvastatin Other (See Comments)    Muscles hurt  . Statins Other (See Comments)    Muscle aches    Consultations:  Neurology   Inpatient rehab.   Procedures/Studies: Ct Angio Head W Or Wo Contrast  Result Date: 09/03/2018 CLINICAL DATA:  67 y.o. female with history of diabetes mellitus, lipidemia intolerant to statin, ADHD, prior smoker presenting with a 2-day history of gait imbalance and weakness in the right hand. She did not receive IV t-PA due to late presentation. Multiple acute infarcts on MR. Abnormal carotid Dopplers. EXAM: CT ANGIOGRAPHY HEAD AND NECK TECHNIQUE: Multidetector CT imaging of the head and neck was performed using the standard protocol during bolus administration of intravenous contrast.  Multiplanar CT image reconstructions and MIPs were obtained to evaluate the vascular anatomy. Carotid stenosis measurements (when applicable) are obtained utilizing NASCET criteria, using the distal internal carotid diameter as the denominator. CONTRAST:  28m ISOVUE-370 IOPAMIDOL (ISOVUE-370) INJECTION 76% COMPARISON:  MR brain 09/03/2018. FINDINGS: CT HEAD FINDINGS Brain: Increasing conspicuity LEFT centrum semiovale infarct, cytotoxic edema. Other smaller infarcts are poorly visualized. Vascular: Reported separately. Skull: Normal. Negative for fracture or focal lesion. Sinuses: Imaged portions are clear. Orbits: No acute finding. Review of the MIP images confirms the above findings CTA NECK FINDINGS Aortic arch: Standard branching. Imaged portion shows no  evidence of aneurysm or dissection. No significant stenosis of the major arch vessel origins. Right carotid system: No evidence of dissection, stenosis (50% or greater) or occlusion. Calcified and noncalcified plaque at the bifurcation. Left carotid system: No evidence of dissection, stenosis (50% or greater) or occlusion. Noncalcified plaque at the bifurcation. Vertebral arteries: Essentially codominant. No evidence of dissection, stenosis (50% or greater) or occlusion. Skeleton: Spondylosis.  Poor dentition. Other neck: No masses. Airway patent. Subcentimeter calcified RIGHT thyroid lesion. Upper chest: No mass or pneumothorax. Review of the MIP images confirms the above findings CTA HEAD FINDINGS Anterior circulation: No flow-limiting stenosis, proximal occlusion, aneurysm, or vascular malformation of the RIGHT ICA. In the cavernous segment of the LEFT ICA there is high-grade stenosis, flow reducing, estimated 75%. See series 12, image 80 series 13, image 121, series 11, image 111. Remainder of the LEFT ICA widely patent. Mild narrowing distal RIGHT M1 MCA, estimated 50%. No similar narrowing on the LEFT. No M2 or M3 branch occlusion. Posterior circulation: No significant stenosis, proximal occlusion, aneurysm, or vascular malformation. LEFT vertebral dominant contributor. 50% stenosis distal RIGHT vertebral prior to basilar confluence. No PCA stenosis or cerebellar branch occlusion. Venous sinuses: As permitted by contrast timing, patent. Anatomic variants: None of significance Delayed phase: No abnormal intracranial enhancement. Review of the MIP images confirms the above findings IMPRESSION: No flow-limiting extracranial stenosis is observed. Mild nonstenotic irregularity of both carotid bifurcations. Flow-limiting stenosis of the cavernous segment LEFT ICA, estimated 75%. Otherwise no intracranial anterior circulation stenoses or dissection. No abnormal postcontrast enhancement. Electronically Signed   By:  JStaci RighterM.D.   On: 09/03/2018 18:10   Ct Angio Neck W Or Wo Contrast  Result Date: 09/03/2018 CLINICAL DATA:  67y.o. female with history of diabetes mellitus, lipidemia intolerant to statin, ADHD, prior smoker presenting with a 2-day history of gait imbalance and weakness in the right hand. She did not receive IV t-PA due to late presentation. Multiple acute infarcts on MR. Abnormal carotid Dopplers. EXAM: CT ANGIOGRAPHY HEAD AND NECK TECHNIQUE: Multidetector CT imaging of the head and neck was performed using the standard protocol during bolus administration of intravenous contrast. Multiplanar CT image reconstructions and MIPs were obtained to evaluate the vascular anatomy. Carotid stenosis measurements (when applicable) are obtained utilizing NASCET criteria, using the distal internal carotid diameter as the denominator. CONTRAST:  557mISOVUE-370 IOPAMIDOL (ISOVUE-370) INJECTION 76% COMPARISON:  MR brain 09/03/2018. FINDINGS: CT HEAD FINDINGS Brain: Increasing conspicuity LEFT centrum semiovale infarct, cytotoxic edema. Other smaller infarcts are poorly visualized. Vascular: Reported separately. Skull: Normal. Negative for fracture or focal lesion. Sinuses: Imaged portions are clear. Orbits: No acute finding. Review of the MIP images confirms the above findings CTA NECK FINDINGS Aortic arch: Standard branching. Imaged portion shows no evidence of aneurysm or dissection. No significant stenosis of the major arch vessel origins. Right carotid system: No evidence of  dissection, stenosis (50% or greater) or occlusion. Calcified and noncalcified plaque at the bifurcation. Left carotid system: No evidence of dissection, stenosis (50% or greater) or occlusion. Noncalcified plaque at the bifurcation. Vertebral arteries: Essentially codominant. No evidence of dissection, stenosis (50% or greater) or occlusion. Skeleton: Spondylosis.  Poor dentition. Other neck: No masses. Airway patent. Subcentimeter  calcified RIGHT thyroid lesion. Upper chest: No mass or pneumothorax. Review of the MIP images confirms the above findings CTA HEAD FINDINGS Anterior circulation: No flow-limiting stenosis, proximal occlusion, aneurysm, or vascular malformation of the RIGHT ICA. In the cavernous segment of the LEFT ICA there is high-grade stenosis, flow reducing, estimated 75%. See series 12, image 80 series 13, image 121, series 11, image 111. Remainder of the LEFT ICA widely patent. Mild narrowing distal RIGHT M1 MCA, estimated 50%. No similar narrowing on the LEFT. No M2 or M3 branch occlusion. Posterior circulation: No significant stenosis, proximal occlusion, aneurysm, or vascular malformation. LEFT vertebral dominant contributor. 50% stenosis distal RIGHT vertebral prior to basilar confluence. No PCA stenosis or cerebellar branch occlusion. Venous sinuses: As permitted by contrast timing, patent. Anatomic variants: None of significance Delayed phase: No abnormal intracranial enhancement. Review of the MIP images confirms the above findings IMPRESSION: No flow-limiting extracranial stenosis is observed. Mild nonstenotic irregularity of both carotid bifurcations. Flow-limiting stenosis of the cavernous segment LEFT ICA, estimated 75%. Otherwise no intracranial anterior circulation stenoses or dissection. No abnormal postcontrast enhancement. Electronically Signed   By: Staci Righter M.D.   On: 09/03/2018 18:10   Mr Brain Wo Contrast  Result Date: 09/03/2018 CLINICAL DATA:  Balance problems EXAM: MRI HEAD WITHOUT CONTRAST TECHNIQUE: Multiplanar, multiecho pulse sequences of the brain and surrounding structures were obtained without intravenous contrast. COMPARISON:  None. FINDINGS: BRAIN: There are multiple foci of abnormal diffusion restriction within the left frontal and parietal lobes. The largest measures approximately 8 mm and is located in the corona radiata. There are multiple punctate subcortical foci that measure up  to a 3-4 mm. The midline structures are normal. No midline shift or other mass effect. There are no old infarcts. The white matter signal is normal for the patient's age. The cerebral and cerebellar volume are age-appropriate. Susceptibility-sensitive sequences show no chronic microhemorrhage or superficial siderosis. VASCULAR: Major intracranial arterial and venous sinus flow voids are normal. SKULL AND UPPER CERVICAL SPINE: Calvarial bone marrow signal is normal. There is no skull base mass. Visualized upper cervical spine and soft tissues are normal. SINUSES/ORBITS: No fluid levels or advanced mucosal thickening. No mastoid or middle ear effusion. The orbits are normal. IMPRESSION: 1. Multiple small ischemic infarcts the left hemisphere, the largest of which measures 8 mm and is located in the corona radiata. 2. No hemorrhage or mass effect. 3. Otherwise normal brain for age. Electronically Signed   By: Ulyses Jarred M.D.   On: 09/03/2018 01:24       Subjective: Patient is feeling better, her balance has improved but not back to baseline, no nausea or vomiting, no chest pain or dyspnea.   Discharge Exam: Vitals:   09/05/18 0114 09/05/18 0345  BP: (!) 172/74 127/63  Pulse:  60  Resp:  18  Temp:  98.2 F (36.8 C)  SpO2:  98%   Vitals:   09/04/18 1955 09/04/18 2309 09/05/18 0114 09/05/18 0345  BP: 125/69 (!) 135/101 (!) 172/74 127/63  Pulse: 72 66  60  Resp: 18 18  18   Temp: 98.2 F (36.8 C) 98.5 F (36.9 C)  98.2  F (36.8 C)  TempSrc: Oral Oral  Oral  SpO2: 97% 100%  98%  Weight:      Height:        General: Not in pain or dyspnea Neurology: Awake and alert, non focal/ ambulation not assessed  E ENT: no pallor, no icterus, oral mucosa moist Cardiovascular: No JVD. S1-S2 present, rhythmic, no gallops, rubs, or murmurs. No lower extremity edema. Pulmonary: vesicular breath sounds bilaterally, adequate air movement, no wheezing, rhonchi or rales. Gastrointestinal. Abdomen flat,  no organomegaly, non tender, no rebound or guarding Skin. No rashes Musculoskeletal: no joint deformities   The results of significant diagnostics from this hospitalization (including imaging, microbiology, ancillary and laboratory) are listed below for reference.     Microbiology: Recent Results (from the past 240 hour(s))  Culture, blood (Routine X 2) w Reflex to ID Panel     Status: None (Preliminary result)   Collection Time: 09/03/18  2:56 AM  Result Value Ref Range Status   Specimen Description BLOOD RIGHT FOREARM  Final   Special Requests   Final    BOTTLES DRAWN AEROBIC AND ANAEROBIC Blood Culture results may not be optimal due to an inadequate volume of blood received in culture bottles   Culture   Final    NO GROWTH 1 DAY Performed at [facility], [place] [address] [place], [place] [address]    Report Status PENDING  Incomplete  Culture, blood (Routine X 2) w Reflex to ID Panel     Status: None (Preliminary result)   Collection Time: 09/03/18  3:10 AM  Result Value Ref Range Status   Specimen Description BLOOD RIGHT HAND  Final   Special Requests   Final    BOTTLES DRAWN AEROBIC AND ANAEROBIC Blood Culture adequate volume   Culture   Final    NO GROWTH 1 DAY Performed at [facility], [place] [address], [place], [place] [address]    Report Status PENDING  Incomplete     Labs: BNP (last 3 results) No results for input(s): BNP in the last 8760 hours. Basic Metabolic Panel: Recent Labs  Lab 09/02/18 1739  NA 136  K 4.4  CL 102  CO2 26  GLUCOSE 402*  BUN 6*  CREATININE 0.78  CALCIUM 9.2   Liver Function Tests: Recent Labs  Lab 09/02/18 1739  AST 16  ALT 15  ALKPHOS 72  BILITOT 0.6  PROT 7.3  ALBUMIN 3.3*   No results for input(s): LIPASE, AMYLASE in the last 168 hours. No results for input(s): AMMONIA in the last 168 hours. CBC: Recent Labs  Lab 09/02/18 1739  WBC 7.6  NEUTROABS 3.9  HGB 16.3*  HCT 50.1*  MCV 90.4  PLT 312    Cardiac Enzymes: No results for input(s): CKTOTAL, CKMB, CKMBINDEX, TROPONINI in the last 168 hours. BNP: Invalid input(s): POCBNP CBG: Recent Labs  Lab 09/03/18 1207 09/03/18 1714 09/03/18 2123 09/04/18 0628 09/05/18 0620  GLUCAP 313* 279* 137* 198* 154*   D-Dimer No results for input(s): DDIMER in the last 72 hours. Hgb A1c Recent Labs    09/04/18 0518  HGBA1C 11.3*   Lipid Profile Recent Labs    09/04/18 0518  CHOL 180  HDL 26*  LDLCALC 124*  TRIG 150*  CHOLHDL 6.9   Thyroid function studies No results for input(s): TSH, T4TOTAL, T3FREE, THYROIDAB in the last 72 hours.  Invalid input(s): FREET3 Anemia work up No results for input(s): VITAMINB12, FOLATE, FERRITIN, TIBC, IRON, RETICCTPCT in the  last 72 hours. Urinalysis    Component Value Date/Time   COLORURINE YELLOW 09/02/2018 1948   APPEARANCEUR CLEAR 09/02/2018 1948   LABSPEC 1.028 09/02/2018 1948   PHURINE 5.0 09/02/2018 1948   GLUCOSEU >=500 (A) 09/02/2018 1948   HGBUR NEGATIVE 09/02/2018 1948   BILIRUBINUR NEGATIVE 09/02/2018 1948   KETONESUR NEGATIVE 09/02/2018 1948   PROTEINUR NEGATIVE 09/02/2018 1948   UROBILINOGEN 1.0 05/27/2015 0023   NITRITE NEGATIVE 09/02/2018 1948   LEUKOCYTESUR SMALL (A) 09/02/2018 1948   Sepsis Labs Invalid input(s): PROCALCITONIN,  WBC,  LACTICIDVEN Microbiology Recent Results (from the past 240 hour(s))  Culture, blood (Routine X 2) w Reflex to ID Panel     Status: None (Preliminary result)   Collection Time: 09/03/18  2:56 AM  Result Value Ref Range Status   Specimen Description BLOOD RIGHT FOREARM  Final   Special Requests   Final    BOTTLES DRAWN AEROBIC AND ANAEROBIC Blood Culture results may not be optimal due to an inadequate volume of blood received in culture bottles   Culture   Final    NO GROWTH 1 DAY Performed at [facility], [place] [address] [place], [place] [address]    Report Status PENDING  Incomplete  Culture, blood (Routine X 2) w  Reflex to ID Panel     Status: None (Preliminary result)   Collection Time: 09/03/18  3:10 AM  Result Value Ref Range Status   Specimen Description BLOOD RIGHT HAND  Final   Special Requests   Final    BOTTLES DRAWN AEROBIC AND ANAEROBIC Blood Culture adequate volume   Culture   Final    NO GROWTH 1 DAY Performed at [facility], [place] [address], [place], [place] [address]    Report Status PENDING  Incomplete     Time coordinating discharge: 45 minutes  SIGNED:   Tawni Millers, MD  [facility] 09/05/2018, 8:34 AM Pager [phone]  If 7PM-7AM, please contact night-coverage [email or web address] Password [email or web address]

## 2018-09-07 ENCOUNTER — Encounter: Payer: Self-pay | Admitting: Family Medicine

## 2018-09-07 ENCOUNTER — Ambulatory Visit (INDEPENDENT_AMBULATORY_CARE_PROVIDER_SITE_OTHER): Payer: PRIVATE HEALTH INSURANCE | Admitting: Family Medicine

## 2018-09-07 VITALS — BP 128/80 | HR 73 | Ht 65.0 in | Wt 150.2 lb

## 2018-09-07 DIAGNOSIS — E785 Hyperlipidemia, unspecified: Secondary | ICD-10-CM

## 2018-09-07 DIAGNOSIS — E1149 Type 2 diabetes mellitus with other diabetic neurological complication: Secondary | ICD-10-CM

## 2018-09-07 DIAGNOSIS — I69351 Hemiplegia and hemiparesis following cerebral infarction affecting right dominant side: Secondary | ICD-10-CM | POA: Diagnosis not present

## 2018-09-07 DIAGNOSIS — E1142 Type 2 diabetes mellitus with diabetic polyneuropathy: Secondary | ICD-10-CM | POA: Insufficient documentation

## 2018-09-07 DIAGNOSIS — I739 Peripheral vascular disease, unspecified: Secondary | ICD-10-CM

## 2018-09-07 DIAGNOSIS — E1169 Type 2 diabetes mellitus with other specified complication: Secondary | ICD-10-CM

## 2018-09-07 DIAGNOSIS — I639 Cerebral infarction, unspecified: Secondary | ICD-10-CM

## 2018-09-07 MED ORDER — CLOPIDOGREL BISULFATE 75 MG PO TABS: 75.0000 mg | ORAL_TABLET | Freq: Every day | ORAL | 0 refills | Status: DC

## 2018-09-07 MED ORDER — ASPIRIN 81 MG PO TBEC: 81.0000 mg | DELAYED_RELEASE_TABLET | Freq: Every day | ORAL | 0 refills | Status: DC

## 2018-09-07 MED ORDER — DAPAGLIFLOZIN PROPANEDIOL 10 MG PO TABS: 10.0000 mg | ORAL_TABLET | Freq: Every day | ORAL | 2 refills | Status: DC

## 2018-09-07 MED ORDER — GLIPIZIDE 5 MG PO TABS: 5.0000 mg | ORAL_TABLET | Freq: Two times a day (BID) | ORAL | 0 refills | Status: DC

## 2018-09-07 MED ORDER — ATORVASTATIN CALCIUM 20 MG PO TABS: 20.0000 mg | ORAL_TABLET | Freq: Every day | ORAL | 0 refills | Status: DC

## 2018-09-07 MED ORDER — TRUEPLUS LANCETS 28G MISC: 11 refills | Status: AC

## 2018-09-07 MED ORDER — TRUE METRIX METER W/DEVICE KIT: PACK | 0 refills | Status: AC

## 2018-09-07 NOTE — Patient Instructions (Signed)
Complementary and Alternative Medical Therapies for Diabetes Complementary and alternative medical therapies are treatments that are different from typical medical treatments (Western treatments). "Complementary" means that the therapy is used with Western treatments. "Alternative" means that the therapy is used instead of Western treatments. Are these therapies safe? Some of these therapies are usually safe. Others may be harmful. Often, there is not enough research to show how safe or effective a therapy is. If you want to try a complementary or alternative therapy, talk with your health care provider to make sure it is safe. What alternative or complementary therapies are used to treat diabetes? Acupuncture Acupuncture is the insertion of needles into certain places on the skin. This is done by a professional. It is often used to relieve long-term (chronic) pain, especially of the bones and joints. It may help you if you have painful nerve damage. Biofeedback Biofeedback helps you to become more aware of your body's response to pain. It also helps you to learn ways of dealing with pain. Biofeedback is about relaxing and reducing stress. An example of a biofeedback technique is guided imagery. This involves creating peaceful images in your mind. Chromium Chromium is a substance that can help improve how insulin works in the body. Chromium is in many foods, including whole grains, nuts, and egg yolks. Chromium may also be taken as a supplement. Taking chromium supplements may help to control diabetes, especially if you have a lack of chromium (deficiency) in your body. However, research has not proven this. If you have kidney problems, you should be careful with chromium supplements. American ginseng American ginseng is an herb that may lower glucose levels. It may also help lower A1C levels. More research is needed before recommendations for ginseng use can be made. Magnesium Magnesium is a mineral  found in many foods, such as whole grains, nuts, and green leafy vegetables. Having low magnesium levels may make controlling blood glucose more difficult for people who have type 2 diabetes. Low magnesium levels may also contribute to certain diabetes complications. Getting more magnesium and eating a high-fiber diet may reduce the risk of developing type 2 diabetes. Vanadium Vanadium is a compound found in small amounts in certain plants and animals. Some studies show that it improves glucose levels in animals with diabetes. In one study, people with diabetes were able to lower their insulin dosage when taking vanadium. More research about side effects and safe dosage levels is needed. Cinnamon Cinnamon may decrease insulin resistance, increase insulin production, and lower blood glucose levels. It may work best when used with diabetes medicines. Fenugreek Fenugreek is an herb whose seeds are often used in cooking. It may help lower blood glucose by decreasing carbohydrate absorption and increasing insulin production. Summary  Talk with your health care provider about complementary or alternative therapy for you. Some therapies may be appropriate for you, but others may cause side effects.  Follow your diabetes care plan as prescribed. This information is not intended to replace advice given to you by your health care provider. Make sure you discuss any questions you have with your health care provider. Document Released: 09/06/2007 Document Revised: 11/25/2016 Document Reviewed: 11/25/2016 Elsevier Interactive Patient Education  2017 [place] With Diabetes Diabetes (type 1 diabetes mellitus or type 2 diabetes mellitus) is a condition in which the body does not have enough of a hormone called insulin, or the body does not respond properly to insulin. Normally, insulin allows sugars (glucose) to enter cells in the  body. The cells use glucose for energy. With diabetes, extra glucose  builds up in the blood instead of going into cells, which results in high blood glucose (hyperglycemia). How to manage lifestyle changes Managing diabetes includes medical treatments as well as lifestyle changes. If diabetes is not managed well, serious physical and emotional complications can occur. Taking good care of yourself means that you are responsible for:  Monitoring glucose regularly.  Eating a healthy diet.  Exercising regularly.  Meeting with health care providers.  Taking medicines as directed.  Some people may feel a lot of stress about managing their diabetes. This is known as emotional distress, and it is very common. Living with diabetes can place you at risk for emotional distress, depression, or anxiety. These disorders can be confusing and can make diabetes management more difficult. How to recognize stress Emotional distress Symptoms of emotional distress include:  Anger about having a diagnosis of diabetes.  Fear or frustration about your diagnosis and the changes you need to make to manage the condition.  Being overly worried about the care that you need or the cost of the care you need.  Feeling like you caused your condition by doing something wrong.  Fear of unpredictable situations, like low or high blood glucose.  Feeling judged by your health care providers.  Feeling very alone with the disease.  Getting too tired or "burned out" with the demands of daily care.  Depression Having diabetes means that you are at a higher risk for depression. Having depression also means that you are at a higher risk for diabetes. Your health care provider may test (screen) you for symptoms of depression. It is important to recognize depression symptoms and to start treatment for it soon after it is diagnosed. The following are some symptoms of depression:  Loss of interest in things that you used to enjoy.  Trouble sleeping, or often waking up early and not being  able to get back to sleep.  A change in appetite.  Feeling tired most of the day.  Feeling nervous and anxious.  Feeling guilty and worrying that you are a burden to others.  Feeling depressed more often than you do not feel that way.  Thoughts of hurting yourself or feeling that you want to die.  If you have any of these symptoms for 2 weeks or longer, reach out to a health care provider. Where to find support  Ask your health care provider to recommend a therapist who understands both depression and diabetes.  Search for information and support from the American Diabetes Association: www.diabetes.org  Find a certified diabetes educator and make an appointment through [employer]: www.diabeteseducator.org Follow these instructions at home: Managing emotional distress The following are some ways to manage emotional distress:  Talk with your health care provider or certified diabetes educator. Consider working with a counselor or therapist.  Learn as much as you can about diabetes and its treatment. Meet with a certified diabetes educator or take a class to learn how to manage your condition.  Keep a journal of your thoughts and concerns.  Accept that some things are out of your control.  Talk with other people who have diabetes. It can help to talk with others about the emotional distress that you feel.  Find ways to manage stress that work for you. These may include art or music therapy, exercise, meditation, and hobbies.  Seek support from spiritual leaders, family, and friends.  General instructions  Follow your diabetes management plan.  Keep all follow-up visits as told by your health care provider. This is important. Get help right away if:  You have thoughts about hurting yourself or others. If you ever feel like you may hurt yourself or others, or have thoughts about taking your own life, get help right away. You can go to your  nearest emergency department or call:  Your local emergency services (911 in the [place]  A suicide crisis helpline, such as the [facility] at 531-256-4478. This is open 24 hours a day.  Summary  Diabetes (type 1 diabetes mellitus or type 2 diabetes mellitus) is a condition in which the body does not have enough of a hormone called insulin, or the body does not respond properly to insulin.  Living with diabetes puts you at risk for medical issues, and it also puts you at risk for emotional issues such as emotional distress, depression, and anxiety.  Recognizing the symptoms of emotional distress and depression may help you avoid problems with your diabetes control. It is important to start treatment for emotional distress and depression soon after they are diagnosed.  Having diabetes means that you are at a higher risk for depression. Ask your health care provider to recommend a therapist who understands both depression and diabetes.  If you experience symptoms of emotional distress or depression, it is important to discuss this with your health care provider, certified diabetes educator, or therapist. This information is not intended to replace advice given to you by your health care provider. Make sure you discuss any questions you have with your health care provider. Document Released: 03/25/2017 Document Revised: 03/25/2017 Document Reviewed: 03/25/2017 Elsevier Interactive Patient Education  2018 [employer].  Diabetes Mellitus and Exercise Exercising regularly is important for your overall health, especially when you have diabetes (diabetes mellitus). Exercising is not only about losing weight. It has many health benefits, such as increasing muscle strength and bone density and reducing body fat and stress. This leads to improved fitness, flexibility, and endurance, all of which result in better overall health. Exercise has additional benefits for people with  diabetes, including:  Reducing appetite.  Helping to lower and control blood glucose.  Lowering blood pressure.  Helping to control amounts of fatty substances (lipids) in the blood, such as cholesterol and triglycerides.  Helping the body to respond better to insulin (improving insulin sensitivity).  Reducing how much insulin the body needs.  Decreasing the risk for heart disease by: ? Lowering cholesterol and triglyceride levels. ? Increasing the levels of good cholesterol. ? Lowering blood glucose levels.  What is my activity plan? Your health care provider or certified diabetes educator can help you make a plan for the type and frequency of exercise (activity plan) that works for you. Make sure that you:  Do at least 150 minutes of moderate-intensity or vigorous-intensity exercise each week. This could be brisk walking, biking, or water aerobics. ? Do stretching and strength exercises, such as yoga or weightlifting, at least 2 times a week. ? Spread out your activity over at least 3 days of the week.  Get some form of physical activity every day. ? Do not go more than 2 days in a row without some kind of physical activity. ? Avoid being inactive for more than 90 minutes at a time. Take frequent breaks to walk or stretch.  Choose a type of exercise or activity that you enjoy, and set realistic goals.  Start slowly,  and gradually increase the intensity of your exercise over time.  What do I need to know about managing my diabetes?  Check your blood glucose before and after exercising. ? If your blood glucose is higher than 240 mg/dL (13.3 mmol/L) before you exercise, check your urine for ketones. If you have ketones in your urine, do not exercise until your blood glucose returns to normal.  Know the symptoms of low blood glucose (hypoglycemia) and how to treat it. Your risk for hypoglycemia increases during and after exercise. Common symptoms of hypoglycemia can  include: ? Hunger. ? Anxiety. ? Sweating and feeling clammy. ? Confusion. ? Dizziness or feeling light-headed. ? Increased heart rate or palpitations. ? Blurry vision. ? Tingling or numbness around the mouth, lips, or tongue. ? Tremors or shakes. ? Irritability.  Keep a rapid-acting carbohydrate snack available before, during, and after exercise to help prevent or treat hypoglycemia.  Avoid injecting insulin into areas of the body that are going to be exercised. For example, avoid injecting insulin into: ? The arms, when playing tennis. ? The legs, when jogging.  Keep records of your exercise habits. Doing this can help you and your health care provider adjust your diabetes management plan as needed. Write down: ? Food that you eat before and after you exercise. ? Blood glucose levels before and after you exercise. ? The type and amount of exercise you have done. ? When your insulin is expected to peak, if you use insulin. Avoid exercising at times when your insulin is peaking.  When you start a new exercise or activity, work with your health care provider to make sure the activity is safe for you, and to adjust your insulin, medicines, or food intake as needed.  Drink plenty of water while you exercise to prevent dehydration or heat stroke. Drink enough fluid to keep your urine clear or pale yellow. This information is not intended to replace advice given to you by your health care provider. Make sure you discuss any questions you have with your health care provider. Document Released: 01/30/2004 Document Revised: 05/29/2016 Document Reviewed: 04/20/2016 Elsevier Interactive Patient Education  2018 [employer].  Dyslipidemia Dyslipidemia is an imbalance of waxy, fat-like substances (lipids) in the blood. The body needs lipids in small amounts. Dyslipidemia often involves a high level of cholesterol or triglycerides, which are types of lipids. Common forms of dyslipidemia  include:  High levels of bad cholesterol (LDL cholesterol). LDL is the type of cholesterol that causes fatty deposits (plaques) to build up in the blood vessels that carry blood away from your heart (arteries).  Low levels of good cholesterol (HDL cholesterol). HDL cholesterol is the type of cholesterol that protects against heart disease. High levels of HDL remove the LDL buildup from arteries.  High levels of triglycerides. Triglycerides are a fatty substance in the blood that is linked to a buildup of plaques in the arteries.  You can develop dyslipidemia because of the genes you are born with (primary dyslipidemia) or changes that occur during your life (secondary dyslipidemia), or as a side effect of certain medical treatments. What are the causes? Primary dyslipidemia is caused by changes (mutations) in genes that are passed down through families (inherited). These mutations cause several types of dyslipidemia. Mutations can result in disorders that make the body produce too much LDL cholesterol or triglycerides, or not enough HDL cholesterol. These disorders may lead to heart disease, arterial disease, or stroke at an early age. Causes of secondary dyslipidemia  include certain lifestyle choices and diseases that lead to dyslipidemia, such as:  Eating a diet that is high in animal fat.  Not getting enough activity or exercise (having a sedentary lifestyle).  Having diabetes, kidney disease, liver disease, or thyroid disease.  Drinking large amounts of alcohol.  Using certain types of drugs.  What increases the risk? You may be at greater risk for dyslipidemia if you are an older man or if you are a woman who has gone through menopause. Other risk factors include:  Having a family history of dyslipidemia.  Taking certain medicines, including birth control pills, steroids, some diuretics, beta-blockers, and some medicines forHIV.  Smoking cigarettes.  Eating a high-fat  diet.  Drinking large amounts of alcohol.  Having certain medical conditions such as diabetes, polycystic ovary syndrome (PCOS), pregnancy, kidney disease, liver disease, or hypothyroidism.  Not exercising regularly.  Being overweight or obese with too much belly fat.  What are the signs or symptoms? Dyslipidemia does not usually cause any symptoms. Very high lipid levels can cause fatty bumps under the skin (xanthomas) or a white or gray ring around the black center (pupil) of the eye. Very high triglyceride levels can cause inflammation of the pancreas (pancreatitis). How is this diagnosed? Your health care provider may diagnose dyslipidemia based on a routine blood test (fasting blood test). Because most people do not have symptoms of the condition, this blood testing (lipid profile) is done on adults age 61 and older and is repeated every 5 years. This test checks:  Total cholesterol. This is a measure of the total amount of cholesterol in your blood, including LDL cholesterol, HDL cholesterol, and triglycerides. A healthy number is below 200.  LDL cholesterol. The target number for LDL cholesterol is different for each person, depending on individual risk factors. For most people, a number below 100 is healthy. Ask your health care provider what your LDL cholesterol number should be.  HDL cholesterol. An HDL level of 60 or higher is best because it helps to protect against heart disease. A number below 77 for men or below 49 for women increases the risk for heart disease.  Triglycerides. A healthy triglyceride number is below 150.  If your lipid profile is abnormal, your health care provider may do other blood tests to get more information about your condition. How is this treated? Treatment depends on the type of dyslipidemia that you have and your other risk factors for heart disease and stroke. Your health care provider will have a target range for your lipid levels based on this  information. For many people, treatment starts with lifestyle changes, such as diet and exercise. Your health care provider may recommend that you:  Get regular exercise.  Make changes to your diet.  Quit smoking if you smoke.  If diet changes and exercise do not help you reach your goals, your health care provider may also prescribe medicine to lower lipids. The most commonly prescribed type of medicine lowers your LDL cholesterol (statin drug). If you have a high triglyceride level, your provider may prescribe another type of drug (fibrate) or an omega-3 fish oil supplement, or both. Follow these instructions at home:  Take over-the-counter and prescription medicines only as told by your health care provider. This includes supplements.  Get regular exercise. Start an aerobic exercise and strength training program as told by your health care provider. Ask your health care provider what activities are safe for you. Your health care provider may recommend: ?  30 minutes of aerobic activity 4-6 days a week. Brisk walking is an example of aerobic activity. ? Strength training 2 days a week.  Eat a healthy diet as told by your health care provider. This can help you reach and maintain a healthy weight, lower your LDL cholesterol, and raise your HDL cholesterol. It may help to work with a diet and nutrition specialist (dietitian) to make a plan that is right for you. Your dietitian or health care provider may recommend: ? Limiting your calories, if you are overweight. ? Eating more fruits, vegetables, whole grains, fish, and lean meats. ? Limiting saturated fat, trans fat, and cholesterol.  Follow instructions from your health care provider or dietitian about eating or drinking restrictions.  Limit alcohol intake to no more than one drink per day for nonpregnant women and two drinks per day for men. One drink equals 12 oz of beer, 5 oz of wine, or 1 oz of hard liquor.  Do not use any products  that contain nicotine or tobacco, such as cigarettes and e-cigarettes. If you need help quitting, ask your health care provider.  Keep all follow-up visits as told by your health care provider. This is important. Contact a health care provider if:  You are having trouble sticking to your exercise or diet plan.  You are struggling to quit smoking or control your use of alcohol. Summary  Dyslipidemia is an imbalance of waxy, fat-like substances (lipids) in the blood. The body needs lipids in small amounts. Dyslipidemia often involves a high level of cholesterol or triglycerides, which are types of lipids.  Treatment depends on the type of dyslipidemia that you have and your other risk factors for heart disease and stroke.  For many people, treatment starts with lifestyle changes, such as diet and exercise. Your health care provider may also prescribe medicine to lower lipids. This information is not intended to replace advice given to you by your health care provider. Make sure you discuss any questions you have with your health care provider. Document Released: 11/14/2013 Document Revised: 07/06/2016 Document Reviewed: 07/06/2016 Elsevier Interactive Patient Education  2018 [facility] Discharge After a Stroke  Being discharged from the hospital after a stroke can feel overwhelming. Many things may be different, and it is normal to feel scared or anxious. Some stroke survivors may be able to return to their homes, and others may need more specialized care on a temporary or permanent basis. Your stroke care team will work with you to develop a discharge plan that is best for you. Ask questions if you do not understand something. Invite a friend or family member to participate in discharge planning. Understanding and following your discharge plan can help to prevent another stroke or other problems. Understanding your medicines After a stroke, your health care provider may prescribe  one or more types of medicine. It is important to take medicines exactly as told by your health care provider. Serious harm, such as another stroke, can happen if you are unable to take your medicine exactly as prescribed. Make sure you understand:  What medicine to take.  Why you are taking the medicine.  How and when to take it.  If it can be taken with your other medicines and herbal supplements.  Possible side effects.  When to call your health care provider if you have any side effects.  How you will get and pay for your medicines. Medical assistance programs may be able to help you pay  for prescription medicines if you cannot afford them.  If you are taking an anticoagulant, be sure to take it exactly as told by your health care provider. This type of medicine can increase the risk of bleeding because it works to prevent blood from clotting. You may need to take certain precautions to prevent bleeding. You should contact your health care provider if you have:  Bleeding or bruising.  A fall or other injury to your head.  Blood in your urine or stool (feces).  Planning for home safety Take steps to prevent falls, such as installing grab bars or using a shower chair. Ask a friend or family member to get needed things in place before you go home if possible. A therapist can come to your home to make recommendations for safety equipment. Ask your health care provider if you would benefit from this service or from home care. Getting needed equipment Ask your health care provider for a list of any medical equipment and supplies you will need at home. These may include items such as:  Walkers.  Canes.  Wheelchairs.  Hand-strengthening devices.  Special eating utensils.  Medical equipment can be rented or purchased, depending on your insurance coverage. Check with your insurance company about what is covered. Keeping follow-up visits After a stroke, you will need to follow up  regularly with a health care provider. You may also need rehabilitation, which can include physical therapy, occupational therapy, or speech-language therapy. Keeping these appointments is very important to your recovery after a stroke. Be sure to bring your medicine list and discharge papers with you to your appointments. If you need help to keep track of your schedule, use a calendar or appointment reminder. Preventing another stroke Having a stroke puts you at risk for another stroke in the future. Ask your health care provider what actions you can take to lower the risk. These may include:  Increasing how much you exercise.  Making a healthy eating plan.  Quitting smoking.  Managing other health conditions, such as high blood pressure, high cholesterol, or diabetes.  Limiting alcohol use.  Knowing the warning signs of a stroke Make sure you understand the signs of a stroke. Before you leave the hospital, you will receive information outlining the stroke warning signs. Share these with your friends and family members. "BE FAST" is an easy way to remember the main warning signs of a stroke:  B - Balance. Signs are dizziness, sudden trouble walking, or loss of balance.  E - Eyes. Signs are trouble seeing or a sudden change in vision.  F - Face. Signs are sudden weakness or numbness of the face, or the face or eyelid drooping on one side.  A - Arms. Signs are weakness or numbness in an arm. This happens suddenly and usually on one side of the body.  S - Speech. Signs are sudden trouble speaking, slurred speech, or trouble understanding what people say.  T - Time. Time to call emergency services. Write down what time symptoms started.  Other signs of stroke may include:  A sudden, severe headache with no known cause.  Nausea or vomiting.  Seizure.  These symptoms may represent a serious problem that is an emergency. Do not wait to see if the symptoms will go away. Get medical  help right away. Call your local emergency services (911 in the [place] Do not drive yourself to the hospital. Make note of the time that you had your first symptoms. Your emergency responders  or emergency room staff will need to know this information. Summary  Being discharged from the hospital after a stroke can feel overwhelming. It is normal to feel scared or anxious.  Make sure you take medicines exactly as told by your health care provider.  Know the warning signs of a stroke, and get help right way if you have any of these symptoms. "BE FAST" is an easy way to remember the main warning signs of a stroke. This information is not intended to replace advice given to you by your health care provider. Make sure you discuss any questions you have with your health care provider. Document Released: 02/12/2017 Document Revised: 02/12/2017 Document Reviewed: 02/12/2017 Elsevier Interactive Patient Education  2018 [employer].  Type 2 Diabetes Mellitus, Diagnosis, Adult Type 2 diabetes (type 2 diabetes mellitus) is a long-term (chronic) disease. In type 2 diabetes, one or both of these problems may be present:  The pancreas does not make enough of a hormone called insulin.  Cells in the body do not respond properly to insulin that the body makes (insulin resistance).  Normally, insulin allows blood sugar (glucose) to enter cells in the body. The cells use glucose for energy. Insulin resistance or lack of insulin causes excess glucose to build up in the blood instead of going into cells. As a result, high blood glucose (hyperglycemia) develops. What increases the risk? The following factors may make you more likely to develop type 2 diabetes:  Having a family member with type 2 diabetes.  Being overweight or obese.  Having an inactive (sedentary) lifestyle.  Having been diagnosed with insulin resistance.  Having a history of prediabetes, gestational diabetes, or polycystic ovarian  syndrome (PCOS).  Being of American-Indian, African-American, Hispanic/Latino, or Asian/Pacific Islander descent.  What are the signs or symptoms? In the early stage of this condition, you may not have symptoms. Symptoms develop slowly and may include:  Increased thirst (polydipsia).  Increased hunger(polyphagia).  Increased urination (polyuria).  Increased urination during the night (nocturia).  Unexplained weight loss.  Frequent infections that keep coming back (recurring).  Fatigue.  Weakness.  Vision changes, such as blurry vision.  Cuts or bruises that are slow to heal.  Tingling or numbness in the hands or feet.  Dark patches on the skin (acanthosis nigricans).  How is this diagnosed?  This condition is diagnosed based on your symptoms, your medical history, a physical exam, and your blood glucose level. Your blood glucose may be checked with one or more of the following blood tests:  A fasting blood glucose (FBG) test. You will not be allowed to eat (you will fast) for at least 8 hours before a blood sample is taken.  A random blood glucose test. This checks blood glucose at any time of day regardless of when you ate.  An A1c (hemoglobin A1c) blood test. This provides information about blood glucose control over the previous 2-3 months.  An oral glucose tolerance test (OGTT). This measures your blood glucose at two times: ? After fasting. This is your baseline blood glucose level. ? Two hours after drinking a beverage that contains glucose.  You may be diagnosed with type 2 diabetes if:  Your FBG level is 126 mg/dL (7.0 mmol/L) or higher.  Your random blood glucose level is 200 mg/dL (11.1 mmol/L) or higher.  Your A1c level is 6.5% or higher.  Your OGGT result is higher than 200 mg/dL (11.1 mmol/L).  These blood tests may be repeated to confirm your diagnosis.  How is this treated?  Your treatment may be managed by a specialist called an  endocrinologist. Type 2 diabetes may be treated by following instructions from your health care provider about:  Making diet and lifestyle changes. This may include: ? Following an individualized nutrition plan that is developed by a diet and nutrition specialist (registered dietitian). ? Exercising regularly. ? Finding ways to manage stress.  Checking your blood glucose level as often as directed.  Taking diabetes medicines or insulin daily. This helps to keep your blood glucose levels in the healthy range. ? If you use insulin, you may need to adjust the dosage depending on how physically active you are and what foods you eat. Your health care provider will tell you how to adjust your dosage.  Taking medicines to help prevent complications from diabetes, such as: ? Aspirin. ? Medicine to lower cholesterol. ? Medicine to control blood pressure.  Your health care provider will set individualized treatment goals for you. Your goals will be based on your age, other medical conditions you have, and how you respond to diabetes treatment. Generally, the goal of treatment is to maintain the following blood glucose levels:  Before meals (preprandial): 80-130 mg/dL (4.4-7.2 mmol/L).  After meals (postprandial): below 180 mg/dL (10 mmol/L).  A1c level: less than 7%.  Follow these instructions at home: Questions to [facility] Provider Consider asking the following questions:  Do I need to meet with a diabetes educator?  Where can I find a support group for people with diabetes?  What equipment will I need to manage my diabetes at home?  What diabetes medicines do I need, and when should I take them?  How often do I need to check my blood glucose?  What number can I call if I have questions?  When is my next appointment?  General instructions  Take over-the-counter and prescription medicines only as told by your health care provider.  Keep all follow-up visits as told by  your health care provider. This is important.  For more information about diabetes, visit: ? American Diabetes Association (ADA): www.diabetes.org ? American Association of Diabetes Educators (AADE): www.diabeteseducator.org/patient-resources Contact a health care provider if:  Your blood glucose is at or above 240 mg/dL (13.3 mmol/L) for 2 days in a row.  You have been sick or have had a fever for 2 days or longer and you are not getting better.  You have any of the following problems for more than 6 hours: ? You cannot eat or drink. ? You have nausea and vomiting. ? You have diarrhea. Get help right away if:  Your blood glucose is lower than 54 mg/dL (3.0 mmol/L).  You become confused or you have trouble thinking clearly.  You have difficulty breathing.  You have moderate or large ketone levels in your urine. This information is not intended to replace advice given to you by your health care provider. Make sure you discuss any questions you have with your health care provider. Document Released: 11/09/2005 Document Revised: 04/16/2016 Document Reviewed: 12/13/2015 Elsevier Interactive Patient Education  [employer].

## 2018-09-07 NOTE — Progress Notes (Signed)
Subjective:  Patient ID: Wendy Underwood, female    DOB: October 11, 1951  Age: 67 y.o. MRN: [id number]  CC: Establish Care   HPI Wendy Underwood presents for hospital discharge follow-up on 1014 status post left hemispheric stroke on 1011.  Patient was left with mild right-sided motor deficit.  Outpatient rehabilitation has been recommended but patient's insurance would not cover this.  She was given exercises to do at home and has been doing these faithfully with good result.  She has no difficulty with mastication or swallowing her food.  Patient is hypertensive on admission but her blood pressures normalized shortly thereafter.  She has no history of hypertension.  She does not smoke, drink alcohol or use illicit drugs.  She has a history of elevated cholesterol in the past and had been treated with Lipitor.  She did develop some myalgias on that drug.  She has had no trouble taking this same drug since her discharge from the hospital.  She does have a history of diabetes but it has not been treated in 7 years.  She absolutely refuses any medication that requires a needle for administration.  She was discharged on Glucotrol 5 mg twice daily.  Last recorded blood sugar in the hospital was 154.  Hemoglobin A1c measured in the hospital was 11.3.  LDL cholesterol was 124.  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  She recently got her own apartment.  She had been living in a women's shelter prior to that time.  Her only close family member in this area is an estranged adult daughter who did visit her in the hospital.  She plans on developing a social nerve network through the [employer] shelter where she is stayed prior to obtaining her apartment.  She does have scheduled follow-up with vascular surgeon for follow-up of left carotid artery stenosis.  History Wendy Underwood has a past medical history of ADD (attention deficit disorder), ADHD (attention deficit hyperactivity disorder), Anxiety, Diabetes  mellitus, GERD (gastroesophageal reflux disease), Hyperlipidemia, and Menopause.   She has a past surgical history that includes Mouth surgery (2010); Gallbladder surgery (1993); varicose veins (2011and 1991); Knee arthroscopy (2008 and 2009); Wrist surgery; Foot surgery (2012); and Hammertoe reconstruction with weil osteotomy (10/27/2012).   Her family history includes ADD / ADHD in her son; Cancer in her maternal uncle and mother; Depression in her mother; Diabetes in her mother; Emphysema in her maternal aunt; Heart attack in her father and maternal grandfather; Hypertension in her maternal grandfather and mother; Schizophrenia in her mother.She reports that she quit smoking about 11 years ago. Her smoking use included cigarettes. She has a 315.00 pack-year smoking history. She has never used smokeless tobacco. She reports that she does not drink alcohol or use drugs.  Outpatient Medications Prior to Visit  Medication Sig Dispense Refill  . Cholecalciferol (VITAMIN D-3) 1000 units CAPS Take 2,000 Units by mouth daily.    . diphenhydrAMINE HCl, Sleep, (ZZZQUIL) 25 MG CAPS Take 50 mg by mouth at bedtime as needed (for sleep).    Marland Kitchen glucose blood (ACCU-CHEK AVIVA) test strip Use as instructed 100 each 12  . glucose blood (TRUE METRIX BLOOD GLUCOSE TEST) test strip 1 each by Other route 3 (three) times daily. Use as instructed 100 each 12  . MAGNESIUM PO Take 2 tablets by mouth daily.    . Multiple Vitamin (MULTIVITAMIN) tablet Take 1 tablet by mouth daily.    Marland Kitchen omega-3 acid ethyl esters (LOVAZA) 1  g capsule Take 1 capsule (1 g total) by mouth daily at 6 PM. 30 capsule 0  . POTASSIUM PO Take 1 tablet by mouth daily.    . Prenatal Vit-Fe Fumarate-FA (PRENATAL COMPLETE PO) Take 1 tablet by mouth daily.    Marland Kitchen aspirin 81 MG EC tablet Take 1 tablet (81 mg total) by mouth daily. 30 tablet 0  . atorvastatin (LIPITOR) 20 MG tablet Take 1 tablet (20 mg total) by mouth daily at 6 PM. 30 tablet 0  . Blood  Glucose Monitoring Suppl (TRUE METRIX METER) W/DEVICE KIT Use as directed! 1 kit 0  . clopidogrel (PLAVIX) 75 MG tablet Take 1 tablet (75 mg total) by mouth daily. 30 tablet 0  . glipiZIDE (GLUCOTROL) 5 MG tablet Take 1 tablet (5 mg total) by mouth 2 (two) times daily before a meal. 60 tablet 0  . Naproxen Sod-diphenhydrAMINE (ALEVE PM) 220-25 MG TABS Take 1-2 tablets by mouth at bedtime as needed (for nighttime pain in the legs and/or hands).    . TRUEPLUS LANCETS 28G MISC Use TID 100 each 11   No facility-administered medications prior to visit.     ROS Review of Systems  Constitutional: Negative.   HENT: Negative.   Eyes: Negative for photophobia and visual disturbance.  Respiratory: Negative.   Cardiovascular: Negative.   Gastrointestinal: Negative.   Endocrine: Negative for polyphagia and polyuria.  Genitourinary: Negative.   Skin: Negative.   Neurological: Positive for weakness. Negative for numbness and headaches.  Hematological: Negative.   Psychiatric/Behavioral: Negative.     Objective:  BP 128/80   Pulse 73   Ht 5' 5"  (1.651 m)   Wt 150 lb 4 oz (68.2 kg)   SpO2 100%   BMI 25.00 kg/m   Physical Exam  Constitutional: She is oriented to person, place, and time. She appears well-developed and well-nourished. No distress.  HENT:  Head: Normocephalic and atraumatic.  Right Ear: External ear normal.  Left Ear: External ear normal.  Mouth/Throat: Oropharynx is clear and moist. No oropharyngeal exudate.  Eyes: Pupils are equal, round, and reactive to light. Conjunctivae and EOM are normal. Right eye exhibits no discharge. Left eye exhibits no discharge. No scleral icterus.  Neck: Neck supple. No JVD present. No tracheal deviation present. No thyromegaly present.  Cardiovascular: Normal rate, regular rhythm and normal heart sounds.  Pulmonary/Chest: Effort normal and breath sounds normal.  Abdominal: Bowel sounds are normal.  Lymphadenopathy:    She has no cervical  adenopathy.  Neurological: She is alert and oriented to person, place, and time. No cranial nerve deficit.  Skin: Skin is warm and dry. She is not diaphoretic.  Psychiatric: She has a normal mood and affect. Her behavior is normal.      Assessment & Plan:   Zoha was seen today for establish care.  Diagnoses and all orders for this visit:  Dyslipidemia -     atorvastatin (LIPITOR) 20 MG tablet; Take 1 tablet (20 mg total) by mouth daily at 6 PM.  Type 2 diabetes mellitus with neurological complications (HCC) -     glipiZIDE (GLUCOTROL) 5 MG tablet; Take 1 tablet (5 mg total) by mouth 2 (two) times daily before a meal. -     TRUEPLUS LANCETS 28G MISC; Use TID -     dapagliflozin propanediol (FARXIGA) 10 MG TABS tablet; Take 10 mg by mouth daily.  Cerebrovascular accident (CVA), unspecified mechanism ([facility]) -     aspirin 81 MG EC tablet; Take 1 tablet (81  mg total) by mouth daily. -     clopidogrel (PLAVIX) 75 MG tablet; Take 1 tablet (75 mg total) by mouth daily.  Type 2 diabetes mellitus with other specified complication, without long-term current use of insulin (HCC) -     Blood Glucose Monitoring Suppl (TRUE METRIX METER) w/Device KIT; Use as directed!  PVD (peripheral vascular disease) (HCC) -     aspirin 81 MG EC tablet; Take 1 tablet (81 mg total) by mouth daily. -     atorvastatin (LIPITOR) 20 MG tablet; Take 1 tablet (20 mg total) by mouth daily at 6 PM. -     clopidogrel (PLAVIX) 75 MG tablet; Take 1 tablet (75 mg total) by mouth daily.   I have discontinued Wynonia Lawman. Loder's Naproxen Sod-diphenhydrAMINE. I am also having her start on dapagliflozin propanediol. Additionally, I am having her maintain her multivitamin, glucose blood, glucose blood, diphenhydrAMINE HCl (Sleep), Vitamin D-3, MAGNESIUM PO, POTASSIUM PO, Prenatal Vit-Fe Fumarate-FA (PRENATAL COMPLETE PO), omega-3 acid ethyl esters, aspirin, atorvastatin, TRUE METRIX METER, clopidogrel, glipiZIDE, and  TRUEPLUS LANCETS 28G.  Meds ordered this encounter  Medications  . aspirin 81 MG EC tablet    Sig: Take 1 tablet (81 mg total) by mouth daily.    Dispense:  30 tablet    Refill:  0  . atorvastatin (LIPITOR) 20 MG tablet    Sig: Take 1 tablet (20 mg total) by mouth daily at 6 PM.    Dispense:  30 tablet    Refill:  0  . Blood Glucose Monitoring Suppl (TRUE METRIX METER) w/Device KIT    Sig: Use as directed!    Dispense:  1 kit    Refill:  0  . clopidogrel (PLAVIX) 75 MG tablet    Sig: Take 1 tablet (75 mg total) by mouth daily.    Dispense:  30 tablet    Refill:  0  . glipiZIDE (GLUCOTROL) 5 MG tablet    Sig: Take 1 tablet (5 mg total) by mouth 2 (two) times daily before a meal.    Dispense:  60 tablet    Refill:  0  . TRUEPLUS LANCETS 28G MISC    Sig: Use TID    Dispense:  100 each    Refill:  11  . dapagliflozin propanediol (FARXIGA) 10 MG TABS tablet    Sig: Take 10 mg by mouth daily.    Dispense:  30 tablet    Refill:  2   Patient will follow-up in 3 months or sooner if worse.  Had a frank discussion about her need for insulin therapy at this time with her hemoglobin A1c of 11.3.  She adamantly refuses any therapy that involves the needle.  She has assures me that she will do what it takes to lower her blood sugars and gain control of her diabetes on oral therapy.  She is intolerant of Glucophage secondary to GI side effects.  Have added [place] today.  Follow-up: Return in about 3 months (around 12/08/2018), or if symptoms worsen or fail to improve.  Libby Maw, MD

## 2018-09-08 ENCOUNTER — Ambulatory Visit (HOSPITAL_COMMUNITY)
Admission: RE | Admit: 2018-09-08 | Discharge: 2018-09-08 | Disposition: A | Discharge status: Home / Self Care | Payer: PRIVATE HEALTH INSURANCE | Source: Ambulatory Visit | Attending: Interventional Radiology | Admitting: Interventional Radiology

## 2018-09-08 DIAGNOSIS — I771 Stricture of artery: Secondary | ICD-10-CM

## 2018-09-08 LAB — CULTURE, BLOOD (ROUTINE X 2)
CULTURE: NO GROWTH
CULTURE: NO GROWTH
Special Requests: ADEQUATE

## 2018-09-08 NOTE — Consult Note (Signed)
Chief Complaint: Patient was seen in consultation today for left ICA stenosis/left hemispheric stroke 09/02/18  Referring Physician(s): Dr. Arrien/Dr. Ethelene Hal (PCP)  Supervising Physician: Luanne Bras  Patient Status: [facility]  History of Present Illness: Wendy Underwood is a 67 y.o. 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 She presented to [facility] ED on 09/02/18 with complaints of dizziness and being off balance. She had been in her usual state of health, aside from some increased stress due to social situation, until the evening of 10/9 when she became off balance walking to her car and also experienced a strange sensation in her right arm and leg  - these symptoms occurred each time she tried to walk and after 2 days of consistent symptoms she decided to present to ED for evaluation.   MRI brain without contrast was performed on 10/12 which showed multiple ischemic infarcts in the left hemisphere, the largest of which measuring 8 mm and located in the corona radiata; no hemorrhage or mass effect. CTA head and neck were performed after MRI for further evaluation which showed high-grade, flow reducing stenosis of the cavernous segment of the left ICA. Carotid US showed 40 - 59% ICA stenosis bilaterally. Echocardiography showed LV systolic dysfunction 60 - 65% with no thrombosis. She was admitted for further evaluation and management. She did not receive t-PA due to late presentation. She was started on ASA 81 mg and Plavix 75 mg; she refused insulin while inpatient and glucose remained poorly controlled throughout hospital stay. CN exam was essentially in tact bilaterally aside from mild left hand finger grip weakness. She was discharged on 09/05/18 in stable condition  with plans to continue ASA, Plavix, atorvastatin and glipizide. She was instructed to continue physical and speech therapy at home. She presents to [facility] today for consultation with Dr. Estanislado Pandy regarding evaluation and management of left ICA stenosis.  Patient reports that her right wrist and leg feel "wobbly" at times which she can best describe as weakness; she denies foot drop or difficulty walking. She states both are improving as she is practicing her physical therapy exercises at home, she has been unable to establish outpatient physical therapy. She does endorse using a cane at home at time when she is feeling weak. She denies any other residual s/s.   She does agree that she does not monitor her diabetes which she has known about for 4 years however she believes it was present before that, she admits that she does have some issues with keeping her medical appointments due to various social situations as well as ADHD. She states she is now seeing a new PCP due to issues with patients at her previous PCP however she is unsure if she will stay with him or transfer as she does not prefer his bedside manner. She states she was counseled while in the hospital and by PCP regarding DM control - she agrees that she did refuse insulin and that she will always refuse insulin "until the day I die" because of her fear of needles. She states she will take oral DM medications although she does know that has an intolerance to metformin because it caused her severe GI upset. She states she does not check her blood sugar at home because she was not  given a glucometer upon d/c at the hospital or by her PCP and she cannot afford one on her own, she also does not think she would prick her fingers due to her fear of needles.   She reports quitting smoking 11 years ago, she denies drug or ETOH use. She denies heart issues or hospitalizations aside from recent hospitalization for stroke. She reports a "vein operation" with  Dr. Vida Rigger in the 90s, denies any other surgeries - per chart she has had mouth surgery (2010), cholecystectomy (1993), varicose vein surgery (1991 and 2011), knee arthroscopy (2008 and 2009), wrist surgery/foot surgery (2012) and hammertoe reconstruction with weil osteotomy (2013). 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   She endorses neuropathy in both feet which has been present for several years and is somewhat improved with magnesium - she states an intolerance to neurontin. She also reports intermittent blurry/foggy vision that occurs at different times throughout the day, is not associated with any other symptoms and resolves on it's own. This has been present for several years as well. She also endorses floaters in her vision and states she was told she had cataracts at her previous eye exam about 3 years ago. She denies any curtain dropping or partial blindness.  She states she continues to take Plavix 75 mg QD, ASA 81 mg QD, Lipitor 20 mg QHS, omega 3 QD, glipizide 5 mg BID, prenatal vitamin QD, biotin QD, D3 QD, potassium BID, magnesium BID and Zzquil PRN for sleep. She states she has no refills for Plavix or ASA and is unsure where to get them from. Per chart patient was given prescription for glucometer, lancets and was instructed to start [place] by PCP - she states she was not aware of this and will follow up with PCP.  Past Medical History:  Diagnosis Date  . ADD (attention deficit disorder)   . ADHD (attention deficit hyperactivity disorder)   . Anxiety   . Diabetes mellitus   . GERD (gastroesophageal reflux disease)   . Hyperlipidemia   . Menopause     Past Surgical History:  Procedure  Laterality Date  . FOOT SURGERY  2012  . [place]  . HAMMERTOE RECONSTRUCTION WITH WEIL OSTEOTOMY  10/27/2012   Procedure: HAMMERTOE RECONSTRUCTION WITH WEIL OSTEOTOMY;  Surgeon: [name], MD;  Location: [place];  Service: Orthopedics;  Laterality: Right;  Right Second, Third and Fouth  Hammertoe Correction; Right Second and Third Metatarsal Weil Osteotomy; Second and Third Dorsal Capsulotomy;  [employer]  . KNEE ARTHROSCOPY  2008 and 2009   both knees  . MOUTH SURGERY  2010  . varicose veins  2011and 1991  . WRIST SURGERY     cyst removal    Allergies: Atorvastatin and Statins  Medications: Prior to Admission medications   Medication Sig Start Date End Date Taking? Authorizing Provider  aspirin 81 MG EC tablet Take 1 tablet (81 mg total) by mouth daily. 09/07/18 10/07/18  Libby Maw, MD  atorvastatin (LIPITOR) 20 MG tablet Take 1 tablet (20 mg total) by mouth daily at 6 PM. 09/07/18 10/07/18  Libby Maw, MD  Blood  Glucose Monitoring Suppl (TRUE METRIX METER) w/Device KIT Use as directed! 09/07/18   Libby Maw, MD  Cholecalciferol (VITAMIN D-3) 1000 units CAPS Take 2,000 Units by mouth daily.    [provider]  clopidogrel (PLAVIX) 75 MG tablet Take 1 tablet (75 mg total) by mouth daily. 09/07/18 10/07/18  Libby Maw, MD  dapagliflozin propanediol (FARXIGA) 10 MG TABS tablet Take 10 mg by mouth daily. 09/07/18   Libby Maw, MD  diphenhydrAMINE HCl, Sleep, (ZZZQUIL) 25 MG CAPS Take 50 mg by mouth at bedtime as needed (for sleep).    [provider]  glipiZIDE (GLUCOTROL) 5 MG tablet Take 1 tablet (5 mg total) by mouth 2 (two) times daily before a meal. 09/07/18 10/07/18  Libby Maw, MD  glucose blood (ACCU-CHEK AVIVA) test strip Use as instructed 05/10/15   Chari Manning A, NP  glucose blood (TRUE METRIX BLOOD GLUCOSE TEST) test strip 1 each by  Other route 3 (three) times daily. Use as instructed 05/10/15   Chari Manning A, NP  MAGNESIUM PO Take 2 tablets by mouth daily.    [provider]  Multiple Vitamin (MULTIVITAMIN) tablet Take 1 tablet by mouth daily.    [provider]  omega-3 acid ethyl esters (LOVAZA) 1 g capsule Take 1 capsule (1 g total) by mouth daily at 6 PM. 09/05/18 10/05/18  Arrien, Jimmy Picket, MD  POTASSIUM PO Take 1 tablet by mouth daily.    [provider]  Prenatal Vit-Fe Fumarate-FA (PRENATAL COMPLETE PO) Take 1 tablet by mouth daily.    [provider]  TRUEPLUS LANCETS 28G MISC Use TID 09/07/18   Libby Maw, MD     Family History  Problem Relation Age of Onset  . Heart attack Maternal Grandfather   . Hypertension Maternal Grandfather   . Heart attack Father   . Cancer Mother        skin  . Schizophrenia Mother   . Depression Mother   . Diabetes Mother   . Hypertension Mother   . Cancer Maternal Uncle        skin  . Emphysema Maternal Aunt   . ADD / ADHD Son     Social History   Socioeconomic History  . Marital status: Divorced    Spouse name: Not on file  . Number of children: Not on file  . Years of education: Not on file  . Highest education level: Not on file  Occupational History  . Not on file  Social Needs  . Financial resource strain: Not on file  . Food insecurity:    Worry: Not on file    Inability: Not on file  . Transportation needs:    Medical: Not on file    Non-medical: Not on file  Tobacco Use  . Smoking status: Former Smoker    Packs/day: 10.50    Years: 30.00    Pack years: 315.00    Types: Cigarettes    Last attempt to quit: 06/02/2007    Years since quitting: 11.2  . Smokeless tobacco: Never Used  Substance and Sexual Activity  . Alcohol use: No  . Drug use: No  . Sexual activity: Never  Lifestyle  . Physical activity:    Days per week: Not on file    Minutes per session: Not on file  . Stress: Not on  file  Relationships  . Social connections:    Talks on phone: Not on file    Gets together:  Not on file    Attends religious service: Not on file    Active member of club or organization: Not on file    Attends meetings of clubs or organizations: Not on file    Relationship status: Not on file  Other Topics Concern  . Not on file  Social History Narrative  . Not on file     Review of Systems: A 12 point ROS discussed and pertinent positives are indicated in the HPI above.  All other systems are negative.  Review of Systems  Constitutional: Positive for fatigue (in the afternoons - takes frequent naps). Negative for activity change, appetite change, chills, fever and unexpected weight change.  HENT: Negative for ear pain, tinnitus, trouble swallowing and voice change.   Eyes: Positive for visual disturbance. Negative for photophobia, pain and redness.  Respiratory: Negative for cough and shortness of breath.   Cardiovascular: Negative for chest pain.  Gastrointestinal: Negative for abdominal pain, blood in stool, diarrhea, nausea and vomiting.  Neurological: Positive for dizziness (intermittent - resolves spontaneously), weakness (right upper and lower extremity - improving), light-headedness (intermittent - mostly upon standing), numbness (BLE) and headaches (intermittent - resolves with OTC medications). Negative for tremors, syncope, facial asymmetry and speech difficulty.  Psychiatric/Behavioral: Negative for confusion. The patient is nervous/anxious.     Vital Signs: There were no vitals taken for this visit.  Physical Exam  Constitutional: She is oriented to person, place, and time. No distress.  HENT:  Head: Normocephalic.  Eyes:  (+) glasses  Pulmonary/Chest: Effort normal.  Abdominal: She exhibits no distension.  Neurological: She is alert and oriented to person, place, and time.  Skin: Skin is warm and dry. She is not diaphoretic.  Psychiatric: She has a normal mood  and affect. Her behavior is normal. Judgment and thought content normal.     Imaging: Ct Angio Head W Or Wo Contrast  Result Date: 09/03/2018 CLINICAL DATA:  67 y.o. female with history of diabetes mellitus, lipidemia intolerant to statin, ADHD, prior smoker presenting with a 2-day history of gait imbalance and weakness in the right hand. She did not receive IV t-PA due to late presentation. Multiple acute infarcts on MR. Abnormal carotid Dopplers. EXAM: CT ANGIOGRAPHY HEAD AND NECK TECHNIQUE: Multidetector CT imaging of the head and neck was performed using the standard protocol during bolus administration of intravenous contrast. Multiplanar CT image reconstructions and MIPs were obtained to evaluate the vascular anatomy. Carotid stenosis measurements (when applicable) are obtained utilizing NASCET criteria, using the distal internal carotid diameter as the denominator. CONTRAST:  31m ISOVUE-370 IOPAMIDOL (ISOVUE-370) INJECTION 76% COMPARISON:  MR brain 09/03/2018. FINDINGS: CT HEAD FINDINGS Brain: Increasing conspicuity LEFT centrum semiovale infarct, cytotoxic edema. Other smaller infarcts are poorly visualized. Vascular: Reported separately. Skull: Normal. Negative for fracture or focal lesion. Sinuses: Imaged portions are clear. Orbits: No acute finding. Review of the MIP images confirms the above findings CTA NECK FINDINGS Aortic arch: Standard branching. Imaged portion shows no evidence of aneurysm or dissection. No significant stenosis of the major arch vessel origins. Right carotid system: No evidence of dissection, stenosis (50% or greater) or occlusion. Calcified and noncalcified plaque at the bifurcation. Left carotid system: No evidence of dissection, stenosis (50% or greater) or occlusion. Noncalcified plaque at the bifurcation. Vertebral arteries: Essentially codominant. No evidence of dissection, stenosis (50% or greater) or occlusion. Skeleton: Spondylosis.  Poor dentition. Other neck: No  masses. Airway patent. Subcentimeter calcified RIGHT thyroid lesion. Upper chest: No mass or pneumothorax. Review  of the MIP images confirms the above findings CTA HEAD FINDINGS Anterior circulation: No flow-limiting stenosis, proximal occlusion, aneurysm, or vascular malformation of the RIGHT ICA. In the cavernous segment of the LEFT ICA there is high-grade stenosis, flow reducing, estimated 75%. See series 12, image 80 series 13, image 121, series 11, image 111. Remainder of the LEFT ICA widely patent. Mild narrowing distal RIGHT M1 MCA, estimated 50%. No similar narrowing on the LEFT. No M2 or M3 branch occlusion. Posterior circulation: No significant stenosis, proximal occlusion, aneurysm, or vascular malformation. LEFT vertebral dominant contributor. 50% stenosis distal RIGHT vertebral prior to basilar confluence. No PCA stenosis or cerebellar branch occlusion. Venous sinuses: As permitted by contrast timing, patent. Anatomic variants: None of significance Delayed phase: No abnormal intracranial enhancement. Review of the MIP images confirms the above findings IMPRESSION: No flow-limiting extracranial stenosis is observed. Mild nonstenotic irregularity of both carotid bifurcations. Flow-limiting stenosis of the cavernous segment LEFT ICA, estimated 75%. Otherwise no intracranial anterior circulation stenoses or dissection. No abnormal postcontrast enhancement. Electronically Signed   By: Staci Righter M.D.   On: 09/03/2018 18:10   Ct Angio Neck W Or Wo Contrast  Result Date: 09/03/2018 CLINICAL DATA:  67 y.o. female with history of diabetes mellitus, lipidemia intolerant to statin, ADHD, prior smoker presenting with a 2-day history of gait imbalance and weakness in the right hand. She did not receive IV t-PA due to late presentation. Multiple acute infarcts on MR. Abnormal carotid Dopplers. EXAM: CT ANGIOGRAPHY HEAD AND NECK TECHNIQUE: Multidetector CT imaging of the head and neck was performed using the  standard protocol during bolus administration of intravenous contrast. Multiplanar CT image reconstructions and MIPs were obtained to evaluate the vascular anatomy. Carotid stenosis measurements (when applicable) are obtained utilizing NASCET criteria, using the distal internal carotid diameter as the denominator. CONTRAST:  58m ISOVUE-370 IOPAMIDOL (ISOVUE-370) INJECTION 76% COMPARISON:  MR brain 09/03/2018. FINDINGS: CT HEAD FINDINGS Brain: Increasing conspicuity LEFT centrum semiovale infarct, cytotoxic edema. Other smaller infarcts are poorly visualized. Vascular: Reported separately. Skull: Normal. Negative for fracture or focal lesion. Sinuses: Imaged portions are clear. Orbits: No acute finding. Review of the MIP images confirms the above findings CTA NECK FINDINGS Aortic arch: Standard branching. Imaged portion shows no evidence of aneurysm or dissection. No significant stenosis of the major arch vessel origins. Right carotid system: No evidence of dissection, stenosis (50% or greater) or occlusion. Calcified and noncalcified plaque at the bifurcation. Left carotid system: No evidence of dissection, stenosis (50% or greater) or occlusion. Noncalcified plaque at the bifurcation. Vertebral arteries: Essentially codominant. No evidence of dissection, stenosis (50% or greater) or occlusion. Skeleton: Spondylosis.  Poor dentition. Other neck: No masses. Airway patent. Subcentimeter calcified RIGHT thyroid lesion. Upper chest: No mass or pneumothorax. Review of the MIP images confirms the above findings CTA HEAD FINDINGS Anterior circulation: No flow-limiting stenosis, proximal occlusion, aneurysm, or vascular malformation of the RIGHT ICA. In the cavernous segment of the LEFT ICA there is high-grade stenosis, flow reducing, estimated 75%. See series 12, image 80 series 13, image 121, series 11, image 111. Remainder of the LEFT ICA widely patent. Mild narrowing distal RIGHT M1 MCA, estimated 50%. No similar  narrowing on the LEFT. No M2 or M3 branch occlusion. Posterior circulation: No significant stenosis, proximal occlusion, aneurysm, or vascular malformation. LEFT vertebral dominant contributor. 50% stenosis distal RIGHT vertebral prior to basilar confluence. No PCA stenosis or cerebellar branch occlusion. Venous sinuses: As permitted by contrast timing, patent. Anatomic variants: None  of significance Delayed phase: No abnormal intracranial enhancement. Review of the MIP images confirms the above findings IMPRESSION: No flow-limiting extracranial stenosis is observed. Mild nonstenotic irregularity of both carotid bifurcations. Flow-limiting stenosis of the cavernous segment LEFT ICA, estimated 75%. Otherwise no intracranial anterior circulation stenoses or dissection. No abnormal postcontrast enhancement. Electronically Signed   By: Staci Righter M.D.   On: 09/03/2018 18:10   Mr Brain Wo Contrast  Result Date: 09/03/2018 CLINICAL DATA:  Balance problems EXAM: MRI HEAD WITHOUT CONTRAST TECHNIQUE: Multiplanar, multiecho pulse sequences of the brain and surrounding structures were obtained without intravenous contrast. COMPARISON:  None. FINDINGS: BRAIN: There are multiple foci of abnormal diffusion restriction within the left frontal and parietal lobes. The largest measures approximately 8 mm and is located in the corona radiata. There are multiple punctate subcortical foci that measure up to a 3-4 mm. The midline structures are normal. No midline shift or other mass effect. There are no old infarcts. The white matter signal is normal for the patient's age. The cerebral and cerebellar volume are age-appropriate. Susceptibility-sensitive sequences show no chronic microhemorrhage or superficial siderosis. VASCULAR: Major intracranial arterial and venous sinus flow voids are normal. SKULL AND UPPER CERVICAL SPINE: Calvarial bone marrow signal is normal. There is no skull base mass. Visualized upper cervical spine and  soft tissues are normal. SINUSES/ORBITS: No fluid levels or advanced mucosal thickening. No mastoid or middle ear effusion. The orbits are normal. IMPRESSION: 1. Multiple small ischemic infarcts the left hemisphere, the largest of which measures 8 mm and is located in the corona radiata. 2. No hemorrhage or mass effect. 3. Otherwise normal brain for age. Electronically Signed   By: Ulyses Jarred M.D.   On: 09/03/2018 01:24    Labs:  CBC: Recent Labs    09/02/18 1739  WBC 7.6  HGB 16.3*  HCT 50.1*  PLT 312    COAGS: No results for input(s): INR, APTT in the last 8760 hours.  BMP: Recent Labs    09/02/18 1739  NA 136  K 4.4  CL 102  CO2 26  GLUCOSE 402*  BUN 6*  CALCIUM 9.2  CREATININE 0.78  GFRNONAA >60  GFRAA >60    LIVER FUNCTION TESTS: Recent Labs    09/02/18 1739  BILITOT 0.6  AST 16  ALT 15  ALKPHOS 72  PROT 7.3  ALBUMIN 3.3*    TUMOR MARKERS: No results for input(s): AFPTM, CEA, CA199, CHROMGRNA in the last 8760 hours.  Assessment and Plan:  Left hemispheric stroke 09/02/18 secondary to left ICA stenosis - patient was admitted at [facility] until 09/05/18; she did not receive t-PA or NIR intervention at that time. She was instructed to continue physical therapy and speech therapy at home as well as ASA, Plavix, atorvastatin and glipizide. She presented to [facility] today for consultation regarding further evaluation and management of left ICA stenosis.   Discussed possible treatment options including control of DM/BP/HLD, watching for worsening symptoms and surveillance imaging, diagnostic angiogram without same day intervention if needed and diagnostic angiogram with same day intervention if needed. Also discussed in great detail importance of checking FSBS regularly, taking DM medications as instructed and limiting sugar/carbohydrates. She was encouraged to follow up with her PCP regularly for DM management. She was also instructed to continue her Plavix 75 mg QD and  ASA 81 mg QD - she was told to call her PCP for refills of these medications to which she agrees to do.   Patient reports a  very difficult financial and social situation at the moment - she has limited support at home and is very concerned regarding cost of procedure as well as possibility of losing her job/housing if she cannot work for 2 weeks. Discussed that financial aspect of procedure could be arranged by our team prior to procedure and that she may qualify for other forms of insurance/social assistance. Ultimately patient stated that she is unsure how she wishes to proceed and would like about 2 weeks to think about it. She was given Dr. Arlean Hopping card with the numbers for both NIR schedulers written on the back with instructions to call with questions, concerns or if/when she is ready to proceed with one of the options above. Patient stated understanding and stated how thankful she was that Dr. Estanislado Pandy spent time going over each option thoroughly.  Plan for follow-up - patient to call IR schedulers when ready to proceed with further evaluation and/or management by Dr. Estanislado Pandy.  All questions answered and concerns addressed.  Patient conveys understanding and agrees with plan.  Thank you for this interesting consult.  I greatly enjoyed meeting Wendy Underwood and look forward to participating in their care.  A copy of this report was sent to the requesting provider on this date.  Electronically Signed: Joaquim Nam, PA-C 09/08/2018, 11:17 AM   I spent a total of60 Minutes   in face to face in clinical consultation, greater than 50% of which was counseling/coordinating care for left ICA stenosis.

## 2018-09-21 ENCOUNTER — Encounter: Payer: Self-pay | Admitting: Family Medicine

## 2018-09-21 ENCOUNTER — Ambulatory Visit (INDEPENDENT_AMBULATORY_CARE_PROVIDER_SITE_OTHER): Payer: PRIVATE HEALTH INSURANCE | Admitting: Family Medicine

## 2018-09-21 VITALS — BP 120/70 | HR 86 | Ht 65.0 in

## 2018-09-21 DIAGNOSIS — I639 Cerebral infarction, unspecified: Secondary | ICD-10-CM

## 2018-09-21 DIAGNOSIS — E785 Hyperlipidemia, unspecified: Secondary | ICD-10-CM

## 2018-09-21 DIAGNOSIS — I63312 Cerebral infarction due to thrombosis of left middle cerebral artery: Secondary | ICD-10-CM

## 2018-09-21 DIAGNOSIS — E1142 Type 2 diabetes mellitus with diabetic polyneuropathy: Secondary | ICD-10-CM

## 2018-09-21 DIAGNOSIS — I739 Peripheral vascular disease, unspecified: Secondary | ICD-10-CM

## 2018-09-21 DIAGNOSIS — E1149 Type 2 diabetes mellitus with other diabetic neurological complication: Secondary | ICD-10-CM

## 2018-09-21 MED ORDER — DAPAGLIFLOZIN PROPANEDIOL 10 MG PO TABS: 10.0000 mg | ORAL_TABLET | Freq: Every day | ORAL | 2 refills | Status: DC

## 2018-09-21 MED ORDER — CLOPIDOGREL BISULFATE 75 MG PO TABS: 75.0000 mg | ORAL_TABLET | Freq: Every day | ORAL | 5 refills | Status: AC

## 2018-09-21 MED ORDER — GLIPIZIDE 5 MG PO TABS: 5.0000 mg | ORAL_TABLET | Freq: Two times a day (BID) | ORAL | 2 refills | Status: DC

## 2018-09-21 MED ORDER — ATORVASTATIN CALCIUM 20 MG PO TABS: 20.0000 mg | ORAL_TABLET | Freq: Every day | ORAL | 3 refills | Status: DC

## 2018-09-21 MED ORDER — ASPIRIN 81 MG PO TBEC: 81.0000 mg | DELAYED_RELEASE_TABLET | Freq: Every day | ORAL | 0 refills | Status: AC

## 2018-09-21 MED ORDER — METFORMIN HCL ER 500 MG PO TB24: 500.0000 mg | ORAL_TABLET | Freq: Every day | ORAL | 5 refills | Status: DC

## 2018-09-21 NOTE — Patient Instructions (Signed)
Diabetes Mellitus and Exercise Exercising regularly is important for your overall health, especially when you have diabetes (diabetes mellitus). Exercising is not only about losing weight. It has many health benefits, such as increasing muscle strength and bone density and reducing body fat and stress. This leads to improved fitness, flexibility, and endurance, all of which result in better overall health. Exercise has additional benefits for people with diabetes, including:  Reducing appetite.  Helping to lower and control blood glucose.  Lowering blood pressure.  Helping to control amounts of fatty substances (lipids) in the blood, such as cholesterol and triglycerides.  Helping the body to respond better to insulin (improving insulin sensitivity).  Reducing how much insulin the body needs.  Decreasing the risk for heart disease by: ? Lowering cholesterol and triglyceride levels. ? Increasing the levels of good cholesterol. ? Lowering blood glucose levels.  What is my activity plan? Your health care provider or certified diabetes educator can help you make a plan for the type and frequency of exercise (activity plan) that works for you. Make sure that you:  Do at least 150 minutes of moderate-intensity or vigorous-intensity exercise each week. This could be brisk walking, biking, or water aerobics. ? Do stretching and strength exercises, such as yoga or weightlifting, at least 2 times a week. ? Spread out your activity over at least 3 days of the week.  Get some form of physical activity every day. ? Do not go more than 2 days in a row without some kind of physical activity. ? Avoid being inactive for more than 90 minutes at a time. Take frequent breaks to walk or stretch.  Choose a type of exercise or activity that you enjoy, and set realistic goals.  Start slowly, and gradually increase the intensity of your exercise over time.  What do I need to know about managing my  diabetes?  Check your blood glucose before and after exercising. ? If your blood glucose is higher than 240 mg/dL (13.3 mmol/L) before you exercise, check your urine for ketones. If you have ketones in your urine, do not exercise until your blood glucose returns to normal.  Know the symptoms of low blood glucose (hypoglycemia) and how to treat it. Your risk for hypoglycemia increases during and after exercise. Common symptoms of hypoglycemia can include: ? Hunger. ? Anxiety. ? Sweating and feeling clammy. ? Confusion. ? Dizziness or feeling light-headed. ? Increased heart rate or palpitations. ? Blurry vision. ? Tingling or numbness around the mouth, lips, or tongue. ? Tremors or shakes. ? Irritability.  Keep a rapid-acting carbohydrate snack available before, during, and after exercise to help prevent or treat hypoglycemia.  Avoid injecting insulin into areas of the body that are going to be exercised. For example, avoid injecting insulin into: ? The arms, when playing tennis. ? The legs, when jogging.  Keep records of your exercise habits. Doing this can help you and your health care provider adjust your diabetes management plan as needed. Write down: ? Food that you eat before and after you exercise. ? Blood glucose levels before and after you exercise. ? The type and amount of exercise you have done. ? When your insulin is expected to peak, if you use insulin. Avoid exercising at times when your insulin is peaking.  When you start a new exercise or activity, work with your health care provider to make sure the activity is safe for you, and to adjust your insulin, medicines, or food intake as needed.    Drink plenty of water while you exercise to prevent dehydration or heat stroke. Drink enough fluid to keep your urine clear or pale yellow. This information is not intended to replace advice given to you by your health care provider. Make sure you discuss any questions you have with  your health care provider. Document Released: 01/30/2004 Document Revised: 05/29/2016 Document Reviewed: 04/20/2016 Elsevier Interactive Patient Education  2018 [employer].  Complementary and Alternative Medical Therapies for Diabetes Complementary and alternative medical therapies are treatments that are different from typical medical treatments (Western treatments). "Complementary" means that the therapy is used with Western treatments. "Alternative" means that the therapy is used instead of Western treatments. Are these therapies safe? Some of these therapies are usually safe. Others may be harmful. Often, there is not enough research to show how safe or effective a therapy is. If you want to try a complementary or alternative therapy, talk with your health care provider to make sure it is safe. What alternative or complementary therapies are used to treat diabetes? Acupuncture Acupuncture is the insertion of needles into certain places on the skin. This is done by a professional. It is often used to relieve long-term (chronic) pain, especially of the bones and joints. It may help you if you have painful nerve damage. Biofeedback Biofeedback helps you to become more aware of your body's response to pain. It also helps you to learn ways of dealing with pain. Biofeedback is about relaxing and reducing stress. An example of a biofeedback technique is guided imagery. This involves creating peaceful images in your mind. Chromium Chromium is a substance that can help improve how insulin works in the body. Chromium is in many foods, including whole grains, nuts, and egg yolks. Chromium may also be taken as a supplement. Taking chromium supplements may help to control diabetes, especially if you have a lack of chromium (deficiency) in your body. However, research has not proven this. If you have kidney problems, you should be careful with chromium supplements. American ginseng American ginseng is an  herb that may lower glucose levels. It may also help lower A1C levels. More research is needed before recommendations for ginseng use can be made. Magnesium Magnesium is a mineral found in many foods, such as whole grains, nuts, and green leafy vegetables. Having low magnesium levels may make controlling blood glucose more difficult for people who have type 2 diabetes. Low magnesium levels may also contribute to certain diabetes complications. Getting more magnesium and eating a high-fiber diet may reduce the risk of developing type 2 diabetes. Vanadium Vanadium is a compound found in small amounts in certain plants and animals. Some studies show that it improves glucose levels in animals with diabetes. In one study, people with diabetes were able to lower their insulin dosage when taking vanadium. More research about side effects and safe dosage levels is needed. Cinnamon Cinnamon may decrease insulin resistance, increase insulin production, and lower blood glucose levels. It may work best when used with diabetes medicines. Fenugreek Fenugreek is an herb whose seeds are often used in cooking. It may help lower blood glucose by decreasing carbohydrate absorption and increasing insulin production. Summary  Talk with your health care provider about complementary or alternative therapy for you. Some therapies may be appropriate for you, but others may cause side effects.  Follow your diabetes care plan as prescribed. This information is not intended to replace advice given to you by your health care provider. Make sure you discuss any questions you  have with your health care provider. Document Released: 09/06/2007 Document Revised: 11/25/2016 Document Reviewed: 11/25/2016 Elsevier Interactive Patient Education  2017 [employer].

## 2018-09-21 NOTE — Progress Notes (Signed)
Subjective:  Patient ID: Wendy Underwood, female    DOB: 05-16-1951  Age: 67 y.o. MRN: [id number]  CC: Follow-up   HPI Wendy Underwood presents for follow-up of her CVA, type 2 diabetes, dyslipidemia and peripheral vascular disease.  Patient remains adamant not using injectable medications for control of her diabetes.  Currently she is only taking glipizide 5 mg twice daily.  She has not started [place] yet.  She has had GI side effects with metformin in the past but is willing to give it another try.  She is taking the Lipitor without myalgias at this time.  Her blood pressure is stabilized.  She remains on the Plavix and low-dose aspirin.  She has followed up with the vascular surgeon for her left-sided carotid vascular disease.  They are planning an arteriogram and will discuss treatment of this lesion after that test.  Her stroke was considered to be an embolic event.  Outpatient Medications Prior to Visit  Medication Sig Dispense Refill  . Blood Glucose Monitoring Suppl (TRUE METRIX METER) w/Device KIT Use as directed! 1 kit 0  . Cholecalciferol (VITAMIN D-3) 1000 units CAPS Take 2,000 Units by mouth daily.    . diphenhydrAMINE HCl, Sleep, (ZZZQUIL) 25 MG CAPS Take 50 mg by mouth at bedtime as needed (for sleep).    Marland Kitchen glucose blood (ACCU-CHEK AVIVA) test strip Use as instructed 100 each 12  . glucose blood (TRUE METRIX BLOOD GLUCOSE TEST) test strip 1 each by Other route 3 (three) times daily. Use as instructed 100 each 12  . MAGNESIUM PO Take 2 tablets by mouth daily.    . Multiple Vitamin (MULTIVITAMIN) tablet Take 1 tablet by mouth daily.    Marland Kitchen omega-3 acid ethyl esters (LOVAZA) 1 g capsule Take 1 capsule (1 g total) by mouth daily at 6 PM. 30 capsule 0  . POTASSIUM PO Take 1 tablet by mouth daily.    . TRUEPLUS LANCETS 28G MISC Use TID 100 each 11  . aspirin 81 MG EC tablet Take 1 tablet (81 mg total) by mouth daily. 30 tablet 0  . atorvastatin (LIPITOR) 20 MG tablet Take 1  tablet (20 mg total) by mouth daily at 6 PM. 30 tablet 0  . clopidogrel (PLAVIX) 75 MG tablet Take 1 tablet (75 mg total) by mouth daily. 30 tablet 0  . dapagliflozin propanediol (FARXIGA) 10 MG TABS tablet Take 10 mg by mouth daily. 30 tablet 2  . glipiZIDE (GLUCOTROL) 5 MG tablet Take 1 tablet (5 mg total) by mouth 2 (two) times daily before a meal. 60 tablet 0  . Prenatal Vit-Fe Fumarate-FA (PRENATAL COMPLETE PO) Take 1 tablet by mouth daily.     No facility-administered medications prior to visit.     ROS Review of Systems  Constitutional: Negative.   Eyes: Negative for photophobia and visual disturbance.  Respiratory: Negative.   Cardiovascular: Negative.   Gastrointestinal: Negative.   Musculoskeletal: Negative for gait problem and joint swelling.  Skin: Negative.   Allergic/Immunologic: Negative for immunocompromised state.  Neurological: Negative for seizures, speech difficulty and headaches.  Hematological: Bruises/bleeds easily.  Psychiatric/Behavioral: Negative.     Objective:  BP 120/70   Pulse 86   Ht _0  (1.651 m)   SpO2 98%   BMI 25.00 kg/m   BP Readings from Last 3 Encounters:  09/21/18 120/70  09/07/18 128/80  09/05/18 132/61    Wt Readings from Last 3 Encounters:  09/07/18 150 lb 4 oz (68.2 kg)  09/03/18  147 lb 0.8 oz (66.7 kg)  05/21/17 163 lb 12.8 oz (74.3 kg)    Physical Exam  Constitutional: She is oriented to person, place, and time. She appears well-developed and well-nourished. No distress.  HENT:  Head: Normocephalic and atraumatic.  Right Ear: External ear normal.  Left Ear: External ear normal.  Mouth/Throat: No oropharyngeal exudate.  Eyes: Conjunctivae are normal. Right eye exhibits no discharge. Left eye exhibits no discharge. No scleral icterus.  Neck: No JVD present. No tracheal deviation present.  Pulmonary/Chest: Effort normal.  Neurological: She is alert and oriented to person, place, and time.  Skin: She is not diaphoretic.   Psychiatric: She has a normal mood and affect. Her behavior is normal.    Lab Results  Component Value Date   WBC 7.6 09/02/2018   HGB 16.3 (H) 09/02/2018   HCT 50.1 (H) 09/02/2018   PLT 312 09/02/2018   GLUCOSE 402 (H) 09/02/2018   CHOL 180 09/04/2018   TRIG 150 (H) 09/04/2018   HDL 26 (L) 09/04/2018   LDLCALC 124 (H) 09/04/2018   ALT 15 09/02/2018   AST 16 09/02/2018   NA 136 09/02/2018   K 4.4 09/02/2018   CL 102 09/02/2018   CREATININE 0.78 09/02/2018   BUN 6 (L) 09/02/2018   CO2 26 09/02/2018   TSH 1.204 06/01/2012   HGBA1C 11.3 (H) 09/04/2018   MICROALBUR 0.6 05/10/2015    No results found.  Assessment & Plan:   Wendy Underwood was seen today for follow-up.  Diagnoses and all orders for this visit:  Type 2 diabetes mellitus with diabetic polyneuropathy, without long-term current use of insulin (HCC)  Cerebrovascular accident (CVA) due to thrombosis of left middle cerebral artery (HCC) -     atorvastatin (LIPITOR) 20 MG tablet; Take 1 tablet (20 mg total) by mouth daily at 6 PM.  Dyslipidemia -     atorvastatin (LIPITOR) 20 MG tablet; Take 1 tablet (20 mg total) by mouth daily at 6 PM.  Cerebrovascular accident (CVA), unspecified mechanism ([place]) -     clopidogrel (PLAVIX) 75 MG tablet; Take 1 tablet (75 mg total) by mouth daily. -     aspirin 81 MG EC tablet; Take 1 tablet (81 mg total) by mouth daily. -     atorvastatin (LIPITOR) 20 MG tablet; Take 1 tablet (20 mg total) by mouth daily at 6 PM.  PVD (peripheral vascular disease) (HCC) -     clopidogrel (PLAVIX) 75 MG tablet; Take 1 tablet (75 mg total) by mouth daily. -     aspirin 81 MG EC tablet; Take 1 tablet (81 mg total) by mouth daily. -     atorvastatin (LIPITOR) 20 MG tablet; Take 1 tablet (20 mg total) by mouth daily at 6 PM.  Type 2 diabetes mellitus with neurological complications (HCC) -     glipiZIDE (GLUCOTROL) 5 MG tablet; Take 1 tablet (5 mg total) by mouth 2 (two) times daily before a meal. -      dapagliflozin propanediol (FARXIGA) 10 MG TABS tablet; Take 10 mg by mouth daily. -     metFORMIN (GLUCOPHAGE-XR) 500 MG 24 hr tablet; Take 1 tablet (500 mg total) by mouth at bedtime. -     atorvastatin (LIPITOR) 20 MG tablet; Take 1 tablet (20 mg total) by mouth daily at 6 PM.   I have discontinued Wynonia Lawman. Dillow's Prenatal Vit-Fe Fumarate-FA (PRENATAL COMPLETE PO). I am also having her start on metFORMIN. Additionally, I am having her maintain her multivitamin,  glucose blood, glucose blood, diphenhydrAMINE HCl (Sleep), Vitamin D-3, MAGNESIUM PO, POTASSIUM PO, omega-3 acid ethyl esters, TRUE METRIX METER, TRUEPLUS LANCETS 28G, clopidogrel, glipiZIDE, dapagliflozin propanediol, aspirin, and atorvastatin.  Meds ordered this encounter  Medications  . clopidogrel (PLAVIX) 75 MG tablet    Sig: Take 1 tablet (75 mg total) by mouth daily.    Dispense:  30 tablet    Refill:  5  . glipiZIDE (GLUCOTROL) 5 MG tablet    Sig: Take 1 tablet (5 mg total) by mouth 2 (two) times daily before a meal.    Dispense:  60 tablet    Refill:  2  . dapagliflozin propanediol (FARXIGA) 10 MG TABS tablet    Sig: Take 10 mg by mouth daily.    Dispense:  30 tablet    Refill:  2  . aspirin 81 MG EC tablet    Sig: Take 1 tablet (81 mg total) by mouth daily.    Dispense:  365 tablet    Refill:  0  . metFORMIN (GLUCOPHAGE-XR) 500 MG 24 hr tablet    Sig: Take 1 tablet (500 mg total) by mouth at bedtime.    Dispense:  30 tablet    Refill:  5  . atorvastatin (LIPITOR) 20 MG tablet    Sig: Take 1 tablet (20 mg total) by mouth daily at 6 PM.    Dispense:  30 tablet    Refill:  3   Suggested that patient may do better on the extended release form of metformin.  She will try 500 mg at night before bed.  Adding Farxiga as well.  Hopefully patient will be able to afford this.  Once again discussed using insulin.  I told her that most of my patients who start insulin are sorry that they did not begin it sooner.  She  still would like to try lowering the hemoglobin A1c with oral therapy.  She is more agreeable today to using insulin if these therapies do not work.  Follow-up as planned with the vascular surgeon.  Plan for now is to use the Plavix for 3 months and then discontinue to aspirin only.  She has had a history of DVT in the past, but it was not recurrent though.  Follow-up: Return in about 2 months (around 11/21/2018).  Libby Maw, MD

## 2018-09-28 ENCOUNTER — Ambulatory Visit: Payer: PRIVATE HEALTH INSURANCE | Admitting: *Deleted

## 2018-09-28 ENCOUNTER — Telehealth (HOSPITAL_COMMUNITY): Payer: Self-pay | Admitting: Radiology

## 2018-09-28 NOTE — Telephone Encounter (Signed)
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 JM

## 2018-11-18 ENCOUNTER — Encounter: Payer: Self-pay | Admitting: Family Medicine

## 2018-11-18 ENCOUNTER — Ambulatory Visit (INDEPENDENT_AMBULATORY_CARE_PROVIDER_SITE_OTHER): Payer: PRIVATE HEALTH INSURANCE | Admitting: Family Medicine

## 2018-11-18 VITALS — BP 128/70 | HR 64 | Ht 65.0 in | Wt 155.1 lb

## 2018-11-18 DIAGNOSIS — E785 Hyperlipidemia, unspecified: Secondary | ICD-10-CM | POA: Diagnosis not present

## 2018-11-18 DIAGNOSIS — E1142 Type 2 diabetes mellitus with diabetic polyneuropathy: Secondary | ICD-10-CM

## 2018-11-18 DIAGNOSIS — I739 Peripheral vascular disease, unspecified: Secondary | ICD-10-CM

## 2018-11-18 DIAGNOSIS — I639 Cerebral infarction, unspecified: Secondary | ICD-10-CM

## 2018-11-18 DIAGNOSIS — E1149 Type 2 diabetes mellitus with other diabetic neurological complication: Secondary | ICD-10-CM

## 2018-11-18 DIAGNOSIS — I6522 Occlusion and stenosis of left carotid artery: Secondary | ICD-10-CM | POA: Insufficient documentation

## 2018-11-18 DIAGNOSIS — I63312 Cerebral infarction due to thrombosis of left middle cerebral artery: Secondary | ICD-10-CM

## 2018-11-18 LAB — BASIC METABOLIC PANEL
BUN: 12 mg/dL (ref 6–23)
CALCIUM: 9.4 mg/dL (ref 8.4–10.5)
CHLORIDE: 105 meq/L (ref 96–112)
CO2: 23 mEq/L (ref 19–32)
Creatinine, Ser: 0.65 mg/dL (ref 0.40–1.20)
GFR: 97.13 mL/min (ref >60.00)
GLUCOSE: 193 mg/dL — AB (ref 70–99)
Potassium: 3.7 mEq/L (ref 3.5–5.1)
SODIUM: 139 meq/L (ref 135–145)

## 2018-11-18 LAB — LDL CHOLESTEROL, DIRECT: Direct LDL: 84 mg/dL

## 2018-11-18 LAB — HEMOGLOBIN A1C: HEMOGLOBIN A1C: 8.7 % — AB (ref 4.6–6.5)

## 2018-11-18 LAB — MICROALBUMIN / CREATININE URINE RATIO
Creatinine,U: 133.9 mg/dL
Microalb Creat Ratio: 1.4 mg/g (ref 0.0–30.0)
Microalb, Ur: 1.9 mg/dL (ref 0.0–1.9)

## 2018-11-18 LAB — LIPID PANEL
CHOLESTEROL: 132 mg/dL (ref 0–200)
HDL: 31.2 mg/dL — ABNORMAL LOW (ref >39.00)
LDL CALC: 74 mg/dL (ref 0–99)
NonHDL: 100.65
Total CHOL/HDL Ratio: 4
Triglycerides: 135 mg/dL (ref 0.0–149.0)
VLDL: 27 mg/dL (ref 0.0–40.0)

## 2018-11-18 MED ORDER — GLIPIZIDE 5 MG PO TABS: 5.0000 mg | ORAL_TABLET | Freq: Two times a day (BID) | ORAL | 2 refills | Status: DC

## 2018-11-18 MED ORDER — DAPAGLIFLOZIN PROPANEDIOL 10 MG PO TABS: 10.0000 mg | ORAL_TABLET | Freq: Every day | ORAL | 2 refills | Status: DC

## 2018-11-18 MED ORDER — METFORMIN HCL ER 500 MG PO TB24: 500.0000 mg | ORAL_TABLET | Freq: Two times a day (BID) | ORAL | 5 refills | Status: DC

## 2018-11-18 MED ORDER — ATORVASTATIN CALCIUM 20 MG PO TABS: 20.0000 mg | ORAL_TABLET | Freq: Every day | ORAL | 3 refills | Status: AC

## 2018-11-18 MED ORDER — CLOPIDOGREL BISULFATE 75 MG PO TABS: 75.0000 mg | ORAL_TABLET | Freq: Every day | ORAL | 3 refills | Status: AC

## 2018-11-18 NOTE — Addendum Note (Signed)
Addended by: Abelino Derrick A on: 11/18/2018 01:04 PM   Modules accepted: Orders

## 2018-11-18 NOTE — Patient Instructions (Signed)
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
## 2018-12-07 ENCOUNTER — Telehealth: Payer: Self-pay

## 2018-12-07 NOTE — Telephone Encounter (Signed)
Copied from [place] [id number]. Topic: General - Other >> Dec 07, 2018  2:58 PM Yvette Rack wrote: Reason for CRM: Christy with [facility] stated they have been unable to reach patient. Alyse Low stated patient is not answering nor returning the calls. Cb# [phone]

## 2018-12-08 ENCOUNTER — Ambulatory Visit: Payer: PRIVATE HEALTH INSURANCE | Admitting: Family Medicine

## 2018-12-14 ENCOUNTER — Telehealth (HOSPITAL_COMMUNITY): Payer: Self-pay | Admitting: Radiology

## 2018-12-14 NOTE — Telephone Encounter (Signed)
Spoke to pt concerning her treatment of the carotid artery stenosis. The patient states that she does want Dr. Estanislado Pandy to treat this and that she is working on finding a ride to and from the hospital. She asked that I call her back tomorrow morning to discuss further. JM

## 2019-02-15 ENCOUNTER — Encounter: Payer: Self-pay | Admitting: Family Medicine

## 2019-02-17 ENCOUNTER — Ambulatory Visit (INDEPENDENT_AMBULATORY_CARE_PROVIDER_SITE_OTHER): Payer: Medicare Other | Admitting: Family Medicine

## 2019-02-17 ENCOUNTER — Encounter: Payer: Self-pay | Admitting: Family Medicine

## 2019-02-17 DIAGNOSIS — E785 Hyperlipidemia, unspecified: Secondary | ICD-10-CM | POA: Diagnosis not present

## 2019-02-17 DIAGNOSIS — E1142 Type 2 diabetes mellitus with diabetic polyneuropathy: Secondary | ICD-10-CM | POA: Diagnosis not present

## 2019-02-17 DIAGNOSIS — I6522 Occlusion and stenosis of left carotid artery: Secondary | ICD-10-CM | POA: Diagnosis not present

## 2019-02-17 MED ORDER — GABAPENTIN 300 MG PO CAPS: 300.0000 mg | ORAL_CAPSULE | Freq: Every day | ORAL | 1 refills | Status: AC

## 2019-02-17 NOTE — Progress Notes (Signed)
Virtual Visit via Telephone Note  I connected with Wendy Underwood on 02/17/19 at  9:00 AM EDT by telephone and verified that I am speaking with the correct person using two identifiers.   I discussed the limitations, risks, security and privacy concerns of performing an evaluation and management service by telephone and the availability of in person appointments. I also discussed with the patient that there may be a patient responsible charge related to this service. The patient expressed understanding and agreed to proceed.  Interactive video and audio telecommunications were attempted between myself and the patient. However they failed due to the patient having technical difficulties or not having access to video capability. We continued and completed with audio only.   History of Present Illness: Patient presents for follow-up of her diabetes.  She has noted some loosening of her stools taking the Glucophage 500 mg XL twice daily but is otherwise tolerating it.  She is also tolerating and affording the [place] at this point.  Her fasting sugars have been been around 140.  Continues to take atorvastatin without issue for her LDL cholesterol that is been well controlled.  She reports ongoing burning paresthesias in her feet with a sensation of swelling.  She has been using compression stockings and elevation and treat this.  She is interested in taking a medication for this problem.  This is been an ongoing issue for her for some years now.  Her hands are somewhat affected as well.     Observations/Objective: Patient sounds well and answers questions appropriately and appears to be in no acute distress.   Assessment and Plan:1. Stenosis of left carotid artery  - Lipid panel; Future  2. Type 2 diabetes mellitus with diabetic polyneuropathy, without long-term current use of insulin (HCC)1. Stenosis of left carotid artery  - Lipid panel; Future  2. Type 2 diabetes mellitus with diabetic  polyneuropathy, without long-term current use of insulin (HCC)  - CBC; Future - Comprehensive metabolic panel; Future - Hemoglobin A1c; Future - Microalbumin / creatinine urine ratio; Future - gabapentin (NEURONTIN) 300 MG capsule; Take 1 capsule (300 mg total) by mouth at bedtime.  Dispense: 90 capsule; Refill: 1  3. Dyslipidemia  - Comprehensive metabolic panel; Future - Lipid panel; Future  - CBC; Future - Comprehensive metabolic panel; Future - Hemoglobin A1c; Future - Microalbumin / creatinine urine ratio; Future  3. Dyslipidemia  - Comprehensive metabolic panel; Future - Lipid panel; Future   Follow Up Instructions:    I discussed the assessment and treatment plan with the patient. The patient was provided an opportunity to ask questions and all were answered. The patient agreed with the plan and demonstrated an understanding of the instructions.   The patient was advised to call back or seek an in-person evaluation if the symptoms worsen or if the condition fails to improve as anticipated.  I provided 30 minutes of non-face-to-face time during this encounter.  Patient will call and make a laboratory appointment and return fasting for above ordered blood work.  We discussed the possibility of increasing her Glucophage dosage pending results of her upcoming hemoglobin A1c.  Follow-up visit in 3 months.   Libby Maw, MD

## 2019-03-10 ENCOUNTER — Ambulatory Visit (INDEPENDENT_AMBULATORY_CARE_PROVIDER_SITE_OTHER): Payer: PRIVATE HEALTH INSURANCE | Admitting: Family Medicine

## 2019-03-10 ENCOUNTER — Other Ambulatory Visit (INDEPENDENT_AMBULATORY_CARE_PROVIDER_SITE_OTHER): Payer: PRIVATE HEALTH INSURANCE

## 2019-03-10 ENCOUNTER — Encounter: Payer: Self-pay | Admitting: Family Medicine

## 2019-03-10 VITALS — Ht 65.0 in

## 2019-03-10 DIAGNOSIS — I6522 Occlusion and stenosis of left carotid artery: Secondary | ICD-10-CM

## 2019-03-10 DIAGNOSIS — E1142 Type 2 diabetes mellitus with diabetic polyneuropathy: Secondary | ICD-10-CM

## 2019-03-10 DIAGNOSIS — E1149 Type 2 diabetes mellitus with other diabetic neurological complication: Secondary | ICD-10-CM

## 2019-03-10 DIAGNOSIS — E785 Hyperlipidemia, unspecified: Secondary | ICD-10-CM

## 2019-03-10 DIAGNOSIS — I63312 Cerebral infarction due to thrombosis of left middle cerebral artery: Secondary | ICD-10-CM | POA: Diagnosis not present

## 2019-03-10 DIAGNOSIS — D751 Secondary polycythemia: Secondary | ICD-10-CM

## 2019-03-10 DIAGNOSIS — E1165 Type 2 diabetes mellitus with hyperglycemia: Secondary | ICD-10-CM

## 2019-03-10 LAB — COMPREHENSIVE METABOLIC PANEL
ALT: 15 U/L (ref 0–35)
AST: 14 U/L (ref 0–37)
Albumin: 3.9 g/dL (ref 3.5–5.2)
Alkaline Phosphatase: 60 U/L (ref 39–117)
BUN: 10 mg/dL (ref 6–23)
CO2: 23 mEq/L (ref 19–32)
Calcium: 9.3 mg/dL (ref 8.4–10.5)
Chloride: 103 mEq/L (ref 96–112)
Creatinine, Ser: 0.63 mg/dL (ref 0.40–1.20)
GFR: 94.65 mL/min (ref >60.00)
Glucose, Bld: 275 mg/dL — ABNORMAL HIGH (ref 70–99)
Potassium: 3.8 mEq/L (ref 3.5–5.1)
Sodium: 139 mEq/L (ref 135–145)
Total Bilirubin: 0.8 mg/dL (ref 0.2–1.2)
Total Protein: 7.8 g/dL (ref 6.0–8.3)

## 2019-03-10 LAB — LIPID PANEL
Cholesterol: 130 mg/dL (ref 0–200)
HDL: 30.7 mg/dL — ABNORMAL LOW (ref >39.00)
LDL Cholesterol: 69 mg/dL (ref 0–99)
NonHDL: 98.84
Total CHOL/HDL Ratio: 4
Triglycerides: 149 mg/dL (ref 0.0–149.0)
VLDL: 29.8 mg/dL (ref 0.0–40.0)

## 2019-03-10 LAB — HEMOGLOBIN A1C: Hgb A1c MFr Bld: 10.8 % — ABNORMAL HIGH (ref 4.6–6.5)

## 2019-03-10 LAB — MICROALBUMIN / CREATININE URINE RATIO
Creatinine,U: 131.9 mg/dL
Microalb Creat Ratio: 1.4 mg/g (ref 0.0–30.0)
Microalb, Ur: 1.9 mg/dL (ref 0.0–1.9)

## 2019-03-10 LAB — CBC
HCT: 48.4 % — ABNORMAL HIGH (ref 36.0–46.0)
Hemoglobin: 17 g/dL — ABNORMAL HIGH (ref 12.0–15.0)
MCHC: 35.1 g/dL (ref 30.0–36.0)
MCV: 89 fl (ref 78.0–100.0)
Platelets: 267 10*3/uL (ref 150.0–400.0)
RBC: 5.44 Mil/uL — ABNORMAL HIGH (ref 3.87–5.11)
RDW: 12.6 % (ref 11.5–15.5)
WBC: 9 10*3/uL (ref 4.0–10.5)

## 2019-03-10 MED ORDER — METFORMIN HCL 1000 MG PO TABS: 1000.0000 mg | ORAL_TABLET | Freq: Two times a day (BID) | ORAL | 5 refills | Status: DC

## 2019-03-10 MED ORDER — DAPAGLIFLOZIN PROPANEDIOL 10 MG PO TABS: 10.0000 mg | ORAL_TABLET | Freq: Every day | ORAL | 2 refills | Status: DC

## 2019-03-10 NOTE — Progress Notes (Signed)
Established Patient Office Visit  Subjective:  Patient ID: Wendy Underwood, female    DOB: 17-Nov-1951  Age: 68 y.o. MRN: [id number]  CC: No chief complaint on file.   HPI Wendy Underwood presents for   follow-up of her diabetes.  Diabetes is under worse control hemoglobin A1c is up to 10.8 from 8.7.  Patient continues to take her metformin 500 twice daily glipizide 5 mg twice daily.  Turns out that her dapagliflozin was never started.  She has been unable to go for diabetic teaching due to financial restraints.  She continues to take her atorvastatin and her LDL cholesterol was 69.  She has been taking her Plavix as well.  She has not followed up with the vascular surgeon since her stroke.  She now has Medicare and is under less financial restraint.  Unfortunately it sounds as though she may have suffered a CVA earlier this week.  She woke up Monday morning with weakness in her left arm.  She noticed that there was some unsteadiness in her gait.  These symptoms have since resolved.  Blood pressure is never been an issue for her.  It typically runs in the 120s over 60-80 range.  FU with me in one month with all of her medicines.  Past Medical History:  Diagnosis Date  . ADD (attention deficit disorder)   . ADHD (attention deficit hyperactivity disorder)   . Anxiety   . Diabetes mellitus   . GERD (gastroesophageal reflux disease)   . Hyperlipidemia   . Menopause     Past Surgical History:  Procedure Laterality Date  . FOOT SURGERY  2012  . Experiment  . HAMMERTOE RECONSTRUCTION WITH WEIL OSTEOTOMY  10/27/2012   Procedure: HAMMERTOE RECONSTRUCTION WITH WEIL OSTEOTOMY;  Surgeon: [name], MD;  Location: [facility];  Service: Orthopedics;  Laterality: Right;  Right Second, Third and Fouth  Hammertoe Correction; Right Second and Third Metatarsal Weil Osteotomy; Second and Third Dorsal Capsulotomy;  Second Solicitor  . KNEE ARTHROSCOPY   2008 and 2009   both knees  . MOUTH SURGERY  2010  . varicose veins  2011and 1991  . WRIST SURGERY     cyst removal    Family History  Problem Relation Age of Onset  . Heart attack Maternal Grandfather   . Hypertension Maternal Grandfather   . Heart attack Father   . Cancer Mother        skin  . Schizophrenia Mother   . Depression Mother   . Diabetes Mother   . Hypertension Mother   . Cancer Maternal Uncle        skin  . Emphysema Maternal Aunt   . ADD / ADHD Son     Social History   Socioeconomic History  . Marital status: Divorced    Spouse name: Not on file  . Number of children: Not on file  . Years of education: Not on file  . Highest education level: Not on file  Occupational History  . Not on file  Social Needs  . Financial resource strain: Not on file  . Food insecurity:    Worry: Not on file    Inability: Not on file  . Transportation needs:    Medical: Not on file    Non-medical: Not on file  Tobacco Use  . Smoking status: Former Smoker    Packs/day: 10.50    Years: 30.00    Pack years: 315.00  Types: Cigarettes    Last attempt to quit: 06/02/2007    Years since quitting: 11.7  . Smokeless tobacco: Never Used  Substance and Sexual Activity  . Alcohol use: No  . Drug use: No  . Sexual activity: Never  Lifestyle  . Physical activity:    Days per week: Not on file    Minutes per session: Not on file  . Stress: Not on file  Relationships  . Social connections:    Talks on phone: Not on file    Gets together: Not on file    Attends religious service: Not on file    Active member of club or organization: Not on file    Attends meetings of clubs or organizations: Not on file    Relationship status: Not on file  . Intimate partner violence:    Fear of current or ex partner: Not on file    Emotionally abused: Not on file    Physically abused: Not on file    Forced sexual activity: Not on file  Other Topics Concern  . Not on file  Social  History Narrative  . Not on file    Outpatient Medications Prior to Visit  Medication Sig Dispense Refill  . Blood Glucose Monitoring Suppl (TRUE METRIX METER) w/Device KIT Use as directed! 1 kit 0  . Cholecalciferol (VITAMIN D-3) 1000 units CAPS Take 2,000 Units by mouth daily.    . clopidogrel (PLAVIX) 75 MG tablet Take 1 tablet (75 mg total) by mouth daily. 90 tablet 3  . diphenhydrAMINE HCl, Sleep, (ZZZQUIL) 25 MG CAPS Take 50 mg by mouth at bedtime as needed (for sleep).    . gabapentin (NEURONTIN) 300 MG capsule Take 1 capsule (300 mg total) by mouth at bedtime. 90 capsule 1  . glucose blood (ACCU-CHEK AVIVA) test strip Use as instructed 100 each 12  . glucose blood (TRUE METRIX BLOOD GLUCOSE TEST) test strip 1 each by Other route 3 (three) times daily. Use as instructed 100 each 12  . MAGNESIUM PO Take 2 tablets by mouth daily.    . Multiple Vitamin (MULTIVITAMIN) tablet Take 1 tablet by mouth daily.    Marland Kitchen POTASSIUM PO Take 1 tablet by mouth daily.    . TRUEPLUS LANCETS 28G MISC Use TID 100 each 11  . dapagliflozin propanediol (FARXIGA) 10 MG TABS tablet Take 10 mg by mouth daily. 30 tablet 2  . metFORMIN (GLUCOPHAGE-XR) 500 MG 24 hr tablet Take 1 tablet (500 mg total) by mouth 2 (two) times daily. 60 tablet 5  . atorvastatin (LIPITOR) 20 MG tablet Take 1 tablet (20 mg total) by mouth daily at 6 PM. 90 tablet 3  . glipiZIDE (GLUCOTROL) 5 MG tablet Take 1 tablet (5 mg total) by mouth 2 (two) times daily before a meal. 60 tablet 2  . omega-3 acid ethyl esters (LOVAZA) 1 g capsule Take 1 capsule (1 g total) by mouth daily at 6 PM. 30 capsule 0   No facility-administered medications prior to visit.     Allergies  Allergen Reactions  . Atorvastatin Other (See Comments)    Muscles hurt  . Statins Other (See Comments)    Muscle aches    ROS Review of Systems  Constitutional: Negative for chills, diaphoresis, fatigue, fever and unexpected weight change.  HENT: Negative.   Eyes:  Negative for photophobia and visual disturbance.  Respiratory: Negative.   Cardiovascular: Negative.   Gastrointestinal: Negative.   Endocrine: Negative for polyphagia and polyuria.  Genitourinary: Negative.  Allergic/Immunologic: Negative for immunocompromised state.  Neurological: Negative for headaches.  Hematological: Does not bruise/bleed easily.  Psychiatric/Behavioral: Negative.       Objective:    Physical Exam  Constitutional: She is oriented to person, place, and time. She appears well-developed and well-nourished. No distress.  Pulmonary/Chest: Effort normal.  Neurological: She is alert and oriented to person, place, and time.  Skin: Skin is warm and dry. She is not diaphoretic.  Psychiatric: She has a normal mood and affect. Her behavior is normal.    Ht 5' 5"  (1.651 m)   BMI 25.81 kg/m  Wt Readings from Last 3 Encounters:  11/18/18 155 lb 2 oz (70.4 kg)  09/07/18 150 lb 4 oz (68.2 kg)  09/03/18 147 lb 0.8 oz (66.7 kg)     Health Maintenance Due  Topic Date Due  . Hepatitis C Screening  [birth year]  . OPHTHALMOLOGY EXAM  07/26/1963  . COLONOSCOPY  07/26/2003  . FOOT EXAM  05/09/2016  . MAMMOGRAM  05/30/2017  . PAP SMEAR-Modifier  06/30/2018  . DEXA SCAN  07/25/2018  . PNA vac Low Risk Adult (1 of 2 - PCV13) 07/25/2018    There are no preventive care reminders to display for this patient.  Lab Results  Component Value Date   TSH 1.204 06/01/2012   Lab Results  Component Value Date   WBC 9.0 03/10/2019   HGB 17.0 (H) 03/10/2019   HCT 48.4 (H) 03/10/2019   MCV 89.0 03/10/2019   PLT 267.0 03/10/2019   Lab Results  Component Value Date   NA 139 03/10/2019   K 3.8 03/10/2019   CO2 23 03/10/2019   GLUCOSE 275 (H) 03/10/2019   BUN 10 03/10/2019   CREATININE 0.63 03/10/2019   BILITOT 0.8 03/10/2019   ALKPHOS 60 03/10/2019   AST 14 03/10/2019   ALT 15 03/10/2019   PROT 7.8 03/10/2019   ALBUMIN 3.9 03/10/2019   CALCIUM 9.3 03/10/2019    ANIONGAP 8 09/02/2018   GFR 94.65 03/10/2019   Lab Results  Component Value Date   CHOL 130 03/10/2019   Lab Results  Component Value Date   HDL 30.70 (L) 03/10/2019   Lab Results  Component Value Date   LDLCALC 69 03/10/2019   Lab Results  Component Value Date   TRIG 149.0 03/10/2019   Lab Results  Component Value Date   CHOLHDL 4 03/10/2019   Lab Results  Component Value Date   HGBA1C 10.8 (H) 03/10/2019      Assessment & Plan:   Problem List Items Addressed This Visit      Cardiovascular and Mediastinum   Stenosis of left carotid artery   Relevant Orders   Ambulatory referral to Vascular Surgery   RESOLVED: Stroke (cerebrum) ([facility]) - Primary   Relevant Orders   Ambulatory referral to Vascular Surgery     Endocrine   Type 2 diabetes mellitus with neurological complications (HCC)   Relevant Medications   dapagliflozin propanediol (FARXIGA) 10 MG TABS tablet   metFORMIN (GLUCOPHAGE) 1000 MG tablet   Uncontrolled type 2 diabetes mellitus with hyperglycemia (HCC)   Relevant Medications   dapagliflozin propanediol (FARXIGA) 10 MG TABS tablet   metFORMIN (GLUCOPHAGE) 1000 MG tablet   Other Relevant Orders   Ambulatory referral to diabetic education   Ambulatory referral to Endocrinology     Other   Erythrocytosis      Meds ordered this encounter  Medications  . dapagliflozin propanediol (FARXIGA) 10 MG TABS tablet    Sig: Take  10 mg by mouth daily.    Dispense:  30 tablet    Refill:  2  . metFORMIN (GLUCOPHAGE) 1000 MG tablet    Sig: Take 1 tablet (1,000 mg total) by mouth 2 (two) times daily with a meal.    Dispense:  60 tablet    Refill:  5    Follow-up: Return in about 1 month (around 04/09/2019).    Libby Maw, MDVirtual Visit via Video Note  I connected with Wendy Underwood on 03/10/19 at  2:30 PM EDT by a video enabled telemedicine application and verified that I am speaking with the correct person using two identifiers.   I  discussed the limitations of evaluation and management by telemedicine and the availability of in person appointments. The patient expressed understanding and agreed to proceed.  History of Present Illness:    Observations/Objective:   Assessment and Plan:   Follow Up Instructions:    I discussed the assessment and treatment plan with the patient. The patient was provided an opportunity to ask questions and all were answered. The patient agreed with the plan and demonstrated an understanding of the instructions.   The patient was advised to call back or seek an in-person evaluation if the symptoms worsen or if the condition fails to improve as anticipated.  I provided 30 minutes of non-face-to-face time during this encounter.  Patient will start checking her blood pressure on a daily basis and record it.  Resent her dapagliflozin to the pharmacy and encouraged her to take it.  She will be able to start diabetic teaching.  Have increased her Metformin 1000 mg twice daily.  Continue glipizide for now.  She remains adamant about not using an injectable to treat her diabetes.  I suggested that that may be unavoidable. She will check and record her blood pressures on a daily basis.  She is to continue her Plavix and Lipitor.  I have asked for an urgent referral to vascular surgery for consideration of carotid endarterectomy.  No history of erythrocytosis believe that this may be due to dehydration.  Recheck in 1 month.  She quit smoking 12 years ago.

## 2019-03-13 ENCOUNTER — Other Ambulatory Visit: Payer: Self-pay

## 2019-03-13 DIAGNOSIS — Z992 Dependence on renal dialysis: Principal | ICD-10-CM

## 2019-03-13 DIAGNOSIS — N186 End stage renal disease: Secondary | ICD-10-CM

## 2019-03-14 ENCOUNTER — Other Ambulatory Visit: Payer: Self-pay

## 2019-03-14 DIAGNOSIS — I6522 Occlusion and stenosis of left carotid artery: Secondary | ICD-10-CM

## 2019-03-15 ENCOUNTER — Encounter: Payer: Medicare Other | Attending: Family Medicine | Admitting: Registered"

## 2019-03-15 ENCOUNTER — Encounter: Payer: Self-pay | Admitting: Registered"

## 2019-03-15 ENCOUNTER — Other Ambulatory Visit: Payer: Self-pay

## 2019-03-15 DIAGNOSIS — E1165 Type 2 diabetes mellitus with hyperglycemia: Secondary | ICD-10-CM | POA: Diagnosis not present

## 2019-03-15 NOTE — Patient Instructions (Addendum)
Instructions/Goals:   Make sure to get in three meals per day. Try to have balanced meals like the My Plate example (see handout). Include lean proteins, vegetables, fruits, and whole grains at meals.   -Recommend 2-3 carbohydrate choices per meal (30-45 g per meal).   -Recommend eating every 3-5 hours. For a snack include at least 8 g protein and can include ~15 g carbohydrates (See snack list).   -Goal: Check blood sugar 2 times daily. Once fasting and once 1-2 hours after meal. Keep log of blood sugar readings.  Make physical activity a part of your week. Try to include at least 30 minutes of physical activity 5 days each week or at least 150 minutes per week. Regular physical activity promotes overall health-including helping to reduce risk for heart disease and diabetes, promoting mental health, and helping [place] sleep better.   -Goal: Recommend gradually adding in an additional day of walking up to at least 5 days.  -Recommend checking out resources offered through local [place]: [email or web address]://tools.silversneakers.com/Eligibility/CheckEligibility

## 2019-03-15 NOTE — Progress Notes (Signed)
Diabetes Self-Management Education  Visit Type: First/Initial  Appt. Start Time: 1030 Appt. End Time: 1210  03/15/2019  [name], identified by name and date of birth, is a 68 y.o. female with a diagnosis of Diabetes: Type 2.   ASSESSMENT  Height 5\' 7"  (1.702 m), weight 156 lb 3.2 oz (70.9 kg). Body mass index is 24.46 kg/m.   Pt present alone for appointment. Pt reports that she had her 4th stroke about 1.5 weeks ago and that made her more concerned about learning to further manage her diabetes. Reports she did not require any hospitalization for recent stroke. Pt reports that she is not currently checking her blood sugar due to fear/anxiety of needles. Reports she has checked BG in the past when she was first dx with diabetes about 5 years ago and she is willing to start checking it again. Pt became nervous discussing blood sugar monitoring during appointment. Dietitian encouraged pt consider counseling for reported severe anxiety regarding needles. Reports her mother also has diabetes. Pt has questions regarding how she can find out if she can get Silver Sneakers. Pt reports that she currently walks 2 days per week for physical activity. 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    Diabetes Self-Management Education - 03/15/19 1042      Visit Information   Visit Type  First/Initial      Initial Visit   Diabetes Type  Type 2    Are you currently following a meal plan?  No    Are you taking your medications as prescribed?  Yes    Date Diagnosed  2015      Health Coping   How would you rate your overall health?  Good      Psychosocial Assessment   Patient Belief/Attitude about Diabetes  Afraid    Self-care barriers  Unable to determine    Self-management support  Doctor's office    Other persons present  Patient    Patient Concerns  Nutrition/Meal planning;Other (comment)   Cause of diabetes.    Special Needs  None    Preferred  Learning Style  No preference indicated    Learning Readiness  Ready    How often do you need to have someone help you when you read instructions, pamphlets, or other written materials from your doctor or pharmacy?  1 - Never    What is the last grade level you completed in school?  15      Pre-Education Assessment   Patient understands the diabetes disease and treatment process.  Needs Instruction    Patient understands incorporating nutritional management into lifestyle.  Needs Instruction    Patient undertands incorporating physical activity into lifestyle.  Needs Instruction    Patient understands using medications safely.  Demonstrates understanding / competency    Patient understands monitoring blood glucose, interpreting and using results  Needs Review    Patient understands prevention, detection, and treatment of acute complications.  Needs Instruction    Patient understands prevention, detection, and treatment of chronic complications.  Needs Instruction    Patient understands how to develop strategies to address psychosocial issues.  Needs Instruction    Patient understands how to develop strategies to promote health/change behavior.  Needs Instruction      Complications   Last HgB A1C per patient/outside source  10.8 %    How often do you check your blood sugar?  0 times/day (not testing)  Fasting Blood glucose range (mg/dL)  --   Not currently checking.    Postprandial Blood glucose range (mg/dL)  --   Has not started checking blood sugar.    Number of hypoglycemic episodes per month  0    Number of hyperglycemic episodes per week  --   Pt is not checking blood sugar.    Have you had a dilated eye exam in the past 12 months?  No    Have you had a dental exam in the past 12 months?  No    Are you checking your feet?  Yes    How many days per week are you checking your feet?  4      Dietary Intake   Breakfast  (10 AM) cup canned grapefruit, 2 pieces of honey wheat toast, 1  cup coffee (half and half)     Snack (morning)  None reported.     Lunch  (2 PM) ham sandwich on honey wheat bread with mayo, slice of colby jack cheese, Dr Malachi Bonds    Snack (afternoon)  most likely pretzels    Dinner  (10 PM) 1-2 pork spare ribs in crock pot with bbq sauce, peas and corn, cup 1% milk    Dinner is usually between ~6-8 pm   Snack (evening)  None reported.     Beverage(s)  3-4 x 16 oz water (48-64 oz water most days); milk      Exercise   Exercise Type  Light (walking / raking leaves);ADL's    How many days per week to you exercise?  2    How many minutes per day do you exercise?  30    Total minutes per week of exercise  60      Patient Education   Previous Diabetes Education  No    Disease state   Definition of diabetes, type 1 and 2, and the diagnosis of diabetes;Factors that contribute to the development of diabetes    Nutrition management   Role of diet in the treatment of diabetes and the relationship between the three main macronutrients and blood glucose level;Food label reading, portion sizes and measuring food.;Reviewed blood glucose goals for pre and post meals and how to evaluate the patients' food intake on their blood glucose level.;Carbohydrate counting    Physical activity and exercise   Role of exercise on diabetes management, blood pressure control and cardiac health.;Helped patient identify appropriate exercises in relation to his/her diabetes, diabetes complications and other health issue.    Monitoring  Purpose and frequency of SMBG.;Taught/discussed recording of test results and interpretation of SMBG.;Identified appropriate SMBG and/or A1C goals.;Daily foot exams;Yearly dilated eye exam;Interpreting lab values - A1C, lipid, urine microalbumina.    Acute complications  Taught treatment of hypoglycemia - the 15 rule.    Chronic complications  Relationship between chronic complications and blood glucose control;Lipid levels, blood glucose control and heart  disease;Dental care;Reviewed with patient heart disease, higher risk of, and prevention;Retinopathy and reason for yearly dilated eye exams;Assessed and discussed foot care and prevention of foot problems    Personal strategies to promote health  Other (comment)   Discussed community resource IT trainer)      Individualized Goals (developed by patient)   Nutrition  Follow meal plan discussed;General guidelines for healthy choices and portions discussed    Physical Activity  Exercise 3-5 times per week;30 minutes per day    Medications  take my medication as prescribed    Monitoring   test  my blood glucose as discussed;Other (comment)   Check blood sugar twice daily (fasting and once postprandial)    Reducing Risk  examine blood glucose patterns;treat hypoglycemia with 15 grams of carbs if blood glucose less than 70mg /dL;do foot checks daily    Health Coping  Other (comment)      Post-Education Assessment   Patient understands the diabetes disease and treatment process.  Demonstrates understanding / competency    Patient understands incorporating nutritional management into lifestyle.  Demonstrates understanding / competency    Patient undertands incorporating physical activity into lifestyle.  Demonstrates understanding / competency    Patient understands using medications safely.  Demonstrates understanding / competency    Patient understands monitoring blood glucose, interpreting and using results  Demonstrates understanding / competency    Patient understands prevention, detection, and treatment of acute complications.  Demonstrates understanding / competency    Patient understands prevention, detection, and treatment of chronic complications.  Demonstrates understanding / competency    Patient understands how to develop strategies to address psychosocial issues.  Demonstrates understanding / competency      Outcomes   Expected Outcomes  Demonstrated interest in learning. Expect  positive outcomes    Future DMSE  PRN    Program Status  Completed       Individualized Plan for Diabetes Self-Management Training:   Learning Objective:  Patient will have a greater understanding of diabetes self-management. Patient education plan is to attend individual and/or group sessions per assessed needs and concerns.   Plan:  Patient Instructions  Instructions/Goals:   Make sure to get in three meals per day. Try to have balanced meals like the My Plate example (see handout). Include lean proteins, vegetables, fruits, and whole grains at meals.   -Recommend 2-3 carbohydrate choices per meal (30-45 g per meal).   -Recommend eating every 3-5 hours. For a snack include at least 8 g protein and can include ~15 g carbohydrates (See snack list).   -Goal: Check blood sugar 2 times daily. Once fasting and once 1-2 hours after meal. Keep log of blood sugar readings.  Make physical activity a part of your week. Try to include at least 30 minutes of physical activity 5 days each week or at least 150 minutes per week. Regular physical activity promotes overall health-including helping to reduce risk for heart disease and diabetes, promoting mental health, and helping [place] sleep better.   -Goal: Recommend gradually adding in an additional day of walking up to at least 5 days.  -Recommend checking out resources offered through local [place]: https://tools.silversneakers.com/Eligibility/CheckEligibility    Expected Outcomes:  Demonstrated interest in learning. Expect positive outcomes  Education material provided: ADA - How to Thrive: A Guide for Your Journey with Diabetes, My Plate and Carbohydrate counting sheet  If problems or questions, patient to contact team via:  Phone and Email  Future DSME appointment: PRN

## 2019-03-17 ENCOUNTER — Encounter: Payer: Medicare Other | Admitting: Vascular Surgery

## 2019-03-17 ENCOUNTER — Ambulatory Visit (HOSPITAL_COMMUNITY): Payer: Medicare Other | Attending: Vascular Surgery

## 2019-03-17 ENCOUNTER — Encounter: Payer: Self-pay | Admitting: Family

## 2019-03-24 ENCOUNTER — Other Ambulatory Visit: Payer: Self-pay

## 2019-03-24 DIAGNOSIS — I6522 Occlusion and stenosis of left carotid artery: Secondary | ICD-10-CM

## 2019-03-29 ENCOUNTER — Ambulatory Visit (HOSPITAL_COMMUNITY)
Admission: RE | Admit: 2019-03-29 | Discharge: 2019-03-29 | Disposition: A | Discharge status: Home / Self Care | Payer: Medicare Other | Source: Ambulatory Visit | Attending: Family | Admitting: Family

## 2019-03-29 ENCOUNTER — Ambulatory Visit (INDEPENDENT_AMBULATORY_CARE_PROVIDER_SITE_OTHER): Payer: Medicare Other | Admitting: Vascular Surgery

## 2019-03-29 ENCOUNTER — Encounter: Payer: Self-pay | Admitting: Vascular Surgery

## 2019-03-29 ENCOUNTER — Other Ambulatory Visit: Payer: Self-pay

## 2019-03-29 VITALS — BP 153/81 | HR 79 | Temp 97.4°F | Resp 20 | Ht 67.0 in | Wt 156.7 lb

## 2019-03-29 DIAGNOSIS — I6522 Occlusion and stenosis of left carotid artery: Secondary | ICD-10-CM | POA: Insufficient documentation

## 2019-03-29 NOTE — Progress Notes (Signed)
REASON FOR CONSULT:    Left carotid stenosis.  The consult is requested by Dr. Abelino Derrick.  ASSESSMENT & PLAN:   BILATERAL CAROTID DISEASE: This patient has a 40 to 59% carotid stenosis bilaterally.  The patient has had 2 left hemispheric events.  One was in April at which time she was hospitalized and underwent an extensive work-up.  MRI at that time did suggest multiple small ischemic infarcts in the left hemisphere.  The only real significant finding otherwise on her work-up was a high-grade stenosis in the cavernous segment of the left internal carotid artery.  The extracranial carotid arteries were patent with no significant disease.  At that time she was started on Plavix and her aspirin was subsequently discontinued.  I reviewed her carotid duplex scan which shows that the stenosis on the left is fairly smooth.  Thus there is no indication for carotid endarterectomy on the left.  I think it would be worth adding 81 mg of aspirin so that she is now on dual antiplatelet therapy.  If she has recurrent the symptoms in the left despite this then I think she should be evaluated by Dr. Estanislado Pandy to see if the stenosis in the distal ICA is amenable to an endovascular approach.  With respect to her mild bilateral carotid disease I have recommended a follow-up carotid duplex scan in 1 year and I will see her back at that time.  Fortunately she is not a smoker.  She is currently on Plavix and a statin.  She has had some problems with dizziness but both vertebral arteries are patent with antegrade flows thus she has no evidence of vertebrobasilar insufficiency.  CHRONIC VENOUS INSUFFICIENCY: The patient does have a history of venous disease and is undergone previous laser ablation and also venous surgery back in the 80s.  She has multiple questions about her venous disease and we did discuss conservative treatment for this including the importance of intermittent leg elevation and the proper positioning  for this, compression stockings, and importance of exercise, the importance of avoiding prolonged sitting and standing.   Deitra Mayo, MD, FACS Beeper [phone] Office: [phone]   HPI:   Wendy Underwood is a pleasant 68 y.o. female, who back in April developed issues with dizziness and a gait disturbance.  She was admitted and underwent an extensive work-up.  She was found to have multiple small ischemic infarcts on the left and CT angiogram showed a stenosis in the distal internal carotid artery in the cavernous portion.  She was started on Plavix and had no further symptoms until recently when she developed transient weakness in her right arm and right leg which lasted about an hour and resolved completely.  Of note she does have a history of diabetes and this had been under poor control but is under better control now.  She quit smoking in 2008.  Her risk factors for peripheral vascular disease include type 2 diabetes and a history of tobacco use.  She denies any history of hypertension, hypercholesterolemia, or family history of premature cardiovascular disease.  I reviewed the records from the referring office.  The patient was seen by Dr. Ethelene Hal on 03/10/2019.  The patient was there for follow-up of her diabetes.  This had not been under good control with a hemoglobin A1c of up to 10.8.  At that visit it was noted that she had developed weakness in her left arm earlier that week and some unsteadiness of gait.  Those symptoms had  since resolved at the time of her visit.  Past Medical History:  Diagnosis Date  . ADD (attention deficit disorder)   . ADHD (attention deficit hyperactivity disorder)   . Anxiety   . Diabetes mellitus   . GERD (gastroesophageal reflux disease)   . Hyperlipidemia   . Menopause   . Stroke [facility])     Family History  Problem Relation Age of Onset  . Heart attack Maternal Grandfather   . Hypertension Maternal Grandfather   . Heart disease Maternal  Grandfather   . Heart attack Father   . Hyperlipidemia Father   . Heart disease Father   . Cancer Mother        skin  . Schizophrenia Mother   . Depression Mother   . Diabetes Mother   . Hypertension Mother   . Hyperlipidemia Mother   . Cancer Maternal Uncle        skin  . Emphysema Maternal Aunt   . COPD Maternal Aunt   . ADD / ADHD Son     SOCIAL HISTORY: Social History   Socioeconomic History  . Marital status: Divorced    Spouse name: Not on file  . Number of children: Not on file  . Years of education: Not on file  . Highest education level: Not on file  Occupational History  . Not on file  Social Needs  . Financial resource strain: Not on file  . Food insecurity:    Worry: Not on file    Inability: Not on file  . Transportation needs:    Medical: Not on file    Non-medical: Not on file  Tobacco Use  . Smoking status: Former Smoker    Packs/day: 10.50    Years: 30.00    Pack years: 315.00    Types: Cigarettes    Last attempt to quit: 06/02/2007    Years since quitting: 11.8  . Smokeless tobacco: Never Used  Substance and Sexual Activity  . Alcohol use: No  . Drug use: No  . Sexual activity: Never  Lifestyle  . Physical activity:    Days per week: Not on file    Minutes per session: Not on file  . Stress: Not on file  Relationships  . Social connections:    Talks on phone: Not on file    Gets together: Not on file    Attends religious service: Not on file    Active member of club or organization: Not on file    Attends meetings of clubs or organizations: Not on file    Relationship status: Not on file  . Intimate partner violence:    Fear of current or ex partner: Not on file    Emotionally abused: Not on file    Physically abused: Not on file    Forced sexual activity: Not on file  Other Topics Concern  . Not on file  Social History Narrative  . Not on file    Allergies  Allergen Reactions  . Atorvastatin Other (See Comments)    Muscles  hurt  . Statins Other (See Comments)    Muscle aches    Current Outpatient Medications  Medication Sig Dispense Refill  . BIOTIN PO Take by mouth.    . Blood Glucose Monitoring Suppl (TRUE METRIX METER) w/Device KIT Use as directed! 1 kit 0  . Cholecalciferol (VITAMIN D-3) 1000 units CAPS Take 2,000 Units by mouth daily.    . clopidogrel (PLAVIX) 75 MG tablet Take 1 tablet (75 mg  total) by mouth daily. 90 tablet 3  . diphenhydrAMINE HCl, Sleep, (ZZZQUIL) 25 MG CAPS Take 50 mg by mouth at bedtime as needed (for sleep).    . gabapentin (NEURONTIN) 300 MG capsule Take 1 capsule (300 mg total) by mouth at bedtime. 90 capsule 1  . glucose blood (ACCU-CHEK AVIVA) test strip Use as instructed 100 each 12  . glucose blood (TRUE METRIX BLOOD GLUCOSE TEST) test strip 1 each by Other route 3 (three) times daily. Use as instructed 100 each 12  . MAGNESIUM PO Take 2 tablets by mouth daily.    . metFORMIN (GLUCOPHAGE) 1000 MG tablet Take 1 tablet (1,000 mg total) by mouth 2 (two) times daily with a meal. 60 tablet 5  . Multiple Vitamin (MULTIVITAMIN) tablet Take 1 tablet by mouth daily.    Marland Kitchen POTASSIUM PO Take 1 tablet by mouth daily.    . TRUEPLUS LANCETS 28G MISC Use TID 100 each 11  . atorvastatin (LIPITOR) 20 MG tablet Take 1 tablet (20 mg total) by mouth daily at 6 PM. (Patient taking differently: Take 20 mg by mouth. ) 90 tablet 3  . dapagliflozin propanediol (FARXIGA) 10 MG TABS tablet Take 10 mg by mouth daily. (Patient not taking: Reported on 03/29/2019) 30 tablet 2  . glipiZIDE (GLUCOTROL) 5 MG tablet Take 1 tablet (5 mg total) by mouth 2 (two) times daily before a meal. 60 tablet 2  . omega-3 acid ethyl esters (LOVAZA) 1 g capsule Take 1 capsule (1 g total) by mouth daily at 6 PM. 30 capsule 0   No current facility-administered medications for this visit.     REVIEW OF SYSTEMS:  _0  denotes positive finding, _1  denotes negative finding Cardiac  Comments:  Chest pain or chest pressure:     Shortness of breath upon exertion:    Short of breath when lying flat:    Irregular heart rhythm:        Vascular    Pain in calf, thigh, or hip brought on by ambulation:    Pain in feet at night that wakes you up from your sleep:     Blood clot in your veins:    Leg swelling:         Pulmonary    Oxygen at home:    Productive cough:     Wheezing:         Neurologic    Sudden weakness in arms or legs:  x   Sudden numbness in arms or legs:     Sudden onset of difficulty speaking or slurred speech:    Temporary loss of vision in one eye:     Problems with dizziness:  x       Gastrointestinal    Blood in stool:     Vomited blood:         Genitourinary    Burning when urinating:     Blood in urine:        Psychiatric    Major depression:         Hematologic    Bleeding problems:    Problems with blood clotting too easily:        Skin    Rashes or ulcers:        Constitutional    Fever or chills:     PHYSICAL EXAM:   Vitals:   03/29/19 1302 03/29/19 1307  BP: (!) 142/82 (!) 153/81  Pulse: 79   Resp: 20   Temp: (!) 97.4 F (  36.3 C)   SpO2: 97%   Weight: 156 lb 11.2 oz (71.1 kg)   Height: _0  (1.702 m)     GENERAL: The patient is a well-nourished female, in no acute distress. The vital signs are documented above. CARDIAC: There is a regular rate and rhythm.  VASCULAR: I do not detect carotid bruits. She has palpable femoral pulses. I cannot palpate pedal pulses however both feet are warm and well-perfused. She has some small spider veins bilaterally. PULMONARY: There is good air exchange bilaterally without wheezing or rales. ABDOMEN: Soft and non-tender with normal pitched bowel sounds.  I do not palpate an abdominal aortic aneurysm. MUSCULOSKELETAL: There are no major deformities or cyanosis. NEUROLOGIC: No focal weakness or paresthesias are detected. SKIN: There are no ulcers or rashes noted. PSYCHIATRIC: The patient has a normal affect.  DATA:     CAROTID DUPLEX: I have independently interpreted her carotid duplex scan today.  On the right side there is a 40 to 59% stenosis in the proximal ICA.  This is likely at the lower end of that range.  The right vertebral artery is patent with antegrade flow.  On the left side there is a 40 to 59% stenosis in the proximal ICA.  There is antegrade flow in the left vertebral artery.  MRI: I reviewed her MRI from her hospitalization in April.  She had multiple small ischemic infarcts on the left.  CT ANGIOGRAM NECK: I reviewed her CT angiogram of the neck that was done during her admission in April.  She had no significant extracranial carotid disease.  The mild stenosis on both sides was very smooth.  She did have a high-grade stenosis in the cavernous portion of the distal left internal carotid artery.

## 2019-04-04 ENCOUNTER — Telehealth: Payer: Self-pay

## 2019-04-04 NOTE — Telephone Encounter (Signed)
Left message for patient to call back to change appointment to virtual visit.  CRM created.

## 2019-04-05 ENCOUNTER — Ambulatory Visit: Payer: Medicare Other | Admitting: Internal Medicine

## 2019-04-05 ENCOUNTER — Ambulatory Visit (INDEPENDENT_AMBULATORY_CARE_PROVIDER_SITE_OTHER): Payer: Medicare Other | Admitting: Internal Medicine

## 2019-04-05 ENCOUNTER — Encounter: Payer: Self-pay | Admitting: Internal Medicine

## 2019-04-05 DIAGNOSIS — E1149 Type 2 diabetes mellitus with other diabetic neurological complication: Secondary | ICD-10-CM

## 2019-04-05 DIAGNOSIS — E1159 Type 2 diabetes mellitus with other circulatory complications: Secondary | ICD-10-CM | POA: Diagnosis not present

## 2019-04-05 DIAGNOSIS — E119 Type 2 diabetes mellitus without complications: Secondary | ICD-10-CM | POA: Insufficient documentation

## 2019-04-05 DIAGNOSIS — E1165 Type 2 diabetes mellitus with hyperglycemia: Secondary | ICD-10-CM | POA: Diagnosis not present

## 2019-04-05 MED ORDER — METFORMIN HCL 1000 MG PO TABS: 1000.0000 mg | ORAL_TABLET | Freq: Two times a day (BID) | ORAL | 3 refills | Status: AC

## 2019-04-05 MED ORDER — GLIPIZIDE 5 MG PO TABS: 5.0000 mg | ORAL_TABLET | Freq: Two times a day (BID) | ORAL | 2 refills | Status: AC

## 2019-04-05 MED ORDER — EMPAGLIFLOZIN 10 MG PO TABS: 10.0000 mg | ORAL_TABLET | Freq: Every day | ORAL | 1 refills | Status: AC

## 2019-04-05 NOTE — Progress Notes (Signed)
Virtual Visit via Video Note  I connected with Wendy Underwood on 04/05/19 at  9:30 AM EDT by a video enabled telemedicine application and verified that I am speaking with the correct person using two identifiers.   I discussed the limitations of evaluation and management by telemedicine and the availability of in person appointments. The patient expressed understanding and agreed to proceed.  -Location of the patient : Home  -Location of the provider : office  -The names of all persons participating in the telemedicine service : Pt and myself     Name: Wendy Underwood  MRN/ DOB: [id number], [birth year]   Age/ Sex: 68 y.o., female    PCP: Libby Maw, MD   Reason for Endocrinology Evaluation: Type 2 Diabetes Mellitus     Date of Initial Endocrinology Visit: 04/05/2019     PATIENT IDENTIFIER: [name] is a 68 y.o. female with a past medical history of T2DM,Neuropathy, Dyslipidemia, and carotid artery stenosis. The patient presented for initial endocrinology clinic visit on 04/05/2019 for consultative assistance with her diabetes management.    HPI: Ms. Nam was    Diagnosed with T2DM in 2013 Prior Medications tried/Intolerance: N/A Currently checking blood sugars 1 x a day sporadically  Hypoglycemia episodes : no             Hemoglobin A1c has ranged from 7.1% in 2013, peaking at 11.3% in 2019. Patient required assistance for hypoglycemia: no Patient has required hospitalization within the last 1 year from hyper or hypoglycemia: no  In terms of diet, the patient drinks sweet tea, juice and regular sodas, snacks all the time    HOME DIABETES REGIMEN: Metformin 1000 mg BID  Glipizide 5 mg BID- taking 1 daily  Farxiga - not taking cost $500    Statin: yes ACE-I/ARB: no Prior Diabetic Education: yes   GLUCOSE LOG:  Memory recall (200 's mg/dL)  DIABETIC COMPLICATIONS: Microvascular complications:   Neuropathy  Denies: CKD, retinopathy    Last eye exam: Completed 2018  Macrovascular complications:   CVA  Denies: CAD, PVD   PAST HISTORY: Past Medical History:  Past Medical History:  Diagnosis Date  . ADD (attention deficit disorder)   . ADHD (attention deficit hyperactivity disorder)   . Anxiety   . Diabetes mellitus   . GERD (gastroesophageal reflux disease)   . Hyperlipidemia   . Menopause   . Stroke [facility])     Past Surgical History:  Past Surgical History:  Procedure Laterality Date  . FOOT SURGERY  2012  . [place]  . HAMMERTOE RECONSTRUCTION WITH WEIL OSTEOTOMY  10/27/2012   Procedure: HAMMERTOE RECONSTRUCTION WITH WEIL OSTEOTOMY;  Surgeon: [name], MD;  Location: [place];  Service: Orthopedics;  Laterality: Right;  Right Second, Third and Fouth  Hammertoe Correction; Right Second and Third Metatarsal Weil Osteotomy; Second and Third Dorsal Capsulotomy;  Second Solicitor  . KNEE ARTHROSCOPY  2008 and 2009   both knees  . MOUTH SURGERY  2010  . varicose veins  2011and 1991  . WRIST SURGERY     cyst removal      Social History:  reports that she quit smoking about 11 years ago. Her smoking use included cigarettes. She has a 315.00 pack-year smoking history. She has never used smokeless tobacco. She reports that she does not drink alcohol or use drugs. Family History:  Family History  Problem Relation Age of Onset  . Heart attack Maternal Grandfather   .  Hypertension Maternal Grandfather   . Heart disease Maternal Grandfather   . Heart attack Father   . Hyperlipidemia Father   . Heart disease Father   . Cancer Mother        skin  . Schizophrenia Mother   . Depression Mother   . Diabetes Mother   . Hypertension Mother   . Hyperlipidemia Mother   . Cancer Maternal Uncle        skin  . Emphysema Maternal Aunt   . COPD Maternal Aunt   . ADD / ADHD Son       HOME MEDICATIONS: Allergies as of 04/05/2019      Reactions   Atorvastatin  Other (See Comments)   Muscles hurt   Statins Other (See Comments)   Muscle aches      Medication List       Accurate as of Apr 05, 2019  8:02 AM. If you have any questions, ask your nurse or doctor.        atorvastatin 20 MG tablet Commonly known as:  LIPITOR Take 1 tablet (20 mg total) by mouth daily at 6 PM. What changed:  when to take this   BIOTIN PO Take by mouth.   clopidogrel 75 MG tablet Commonly known as:  PLAVIX Take 1 tablet (75 mg total) by mouth daily.   dapagliflozin propanediol 10 MG Tabs tablet Commonly known as:  FARXIGA Take 10 mg by mouth daily.   gabapentin 300 MG capsule Commonly known as:  NEURONTIN Take 1 capsule (300 mg total) by mouth at bedtime.   glipiZIDE 5 MG tablet Commonly known as:  GLUCOTROL Take 1 tablet (5 mg total) by mouth 2 (two) times daily before a meal.   glucose blood test strip Commonly known as:  Accu-Chek Aviva Use as instructed   glucose blood test strip Commonly known as:  True Metrix Blood Glucose Test 1 each by Other route 3 (three) times daily. Use as instructed   MAGNESIUM PO Take 2 tablets by mouth daily.   metFORMIN 1000 MG tablet Commonly known as:  GLUCOPHAGE Take 1 tablet (1,000 mg total) by mouth 2 (two) times daily with a meal.   multivitamin tablet Take 1 tablet by mouth daily.   omega-3 acid ethyl esters 1 g capsule Commonly known as:  LOVAZA Take 1 capsule (1 g total) by mouth daily at 6 PM.   POTASSIUM PO Take 1 tablet by mouth daily.   True Metrix Meter w/Device Kit Use as directed!   TRUEplus Lancets 28G Misc Use TID   Vitamin D-3 25 MCG (1000 UT) Caps Take 2,000 Units by mouth daily.   ZzzQuil 25 MG Caps Generic drug:  diphenhydrAMINE HCl (Sleep) Take 50 mg by mouth at bedtime as needed (for sleep).        ALLERGIES: Allergies  Allergen Reactions  . Atorvastatin Other (See Comments)    Muscles hurt  . Statins Other (See Comments)    Muscle aches     REVIEW OF  SYSTEMS: A comprehensive ROS was conducted with the patient and is negative except as per HPI and below:  Review of Systems  Constitutional: Negative for chills and fever.  HENT: Negative for congestion and sore throat.   Eyes: Negative for pain and redness.  Respiratory: Negative for cough and shortness of breath.   Gastrointestinal: Negative for diarrhea and nausea.  Genitourinary: Negative for frequency.  Neurological: Positive for tingling. Negative for tremors.  Endo/Heme/Allergies: Negative for polydipsia.  Psychiatric/Behavioral: Negative for  depression and memory loss.        DATA REVIEWED:  Lab Results  Component Value Date   HGBA1C 10.8 (H) 03/10/2019   HGBA1C 8.7 (H) 11/18/2018   HGBA1C 11.3 (H) 09/04/2018   Lab Results  Component Value Date   MICROALBUR 1.9 03/10/2019   LDLCALC 69 03/10/2019   CREATININE 0.63 03/10/2019   Lab Results  Component Value Date   MICRALBCREAT 1.4 03/10/2019    Lab Results  Component Value Date   CHOL 130 03/10/2019   HDL 30.70 (L) 03/10/2019   LDLCALC 69 03/10/2019   LDLDIRECT 84.0 11/18/2018   TRIG 149.0 03/10/2019   CHOLHDL 4 03/10/2019       Old records , labs and images have been reviewed.   ASSESSMENT / PLAN / RECOMMENDATIONS:   1) Type 2 Diabetes Mellitus, Poorly controlled, With neuropathic and macrovascular  complications - Most recent A1c of 10.8 %. Goal A1c < 7.0 %.    Plan: GENERAL:  Poorly controlled diabetes due to sub-optimal medical management and dietary indiscretions.  I have discussed with the patient the pathophysiology of diabetes. We went over the natural progression of the disease. We talked about both insulin resistance and insulin deficiency. We stressed the importance of lifestyle changes including diet and exercise. I explained the complications associated with diabetes including retinopathy, nephropathy, neuropathy as well as increased risk of cardiovascular disease. We went over the benefit seen  with glycemic control.   I explained to the patient that diabetic patients are at higher than normal risk for amputations. The patient was informed that diabetes is the number one cause of non-traumatic amputations in [place].   Pt counseled about avoiding sugar-sweetened beverages and avoiding snacks when possible, we did discuss low carb options for snacks.   Pt is motivated to make changes  We discussed the importance of glucose checks and the availability of this to me , to make proper management decisions  She was unable to get the [place] due to cost of $500 . We did discussed the risk benefit of SGLT-2 inhibitors in glucose, BP and weight control and most importantly the cardiovascular benefits, we also discussed the risk of genital infections. If she gets one infection its ok to continue the medicine as long as she gets treated properly but if she gets recurrent infections, then it would be time to stop it.   MEDICATIONS:  Continue Metformin 1000 mg BID  Increase Glipizide to 5 mg BID with meals   Start Jardiance 10 mg daily   EDUCATION / INSTRUCTIONS:  BG monitoring instructions: Patient is instructed to check her blood sugars 2 times a day, fasting and bedtime.  Call [facility] if: BG persistently < 70 or > 300. . I reviewed the Rule of 15 for the treatment of hypoglycemia in detail with the patient. Literature supplied.   2) Diabetic complications:   Eye: Does not have known diabetic retinopathy.   Neuro/ Feet: Does have known diabetic peripheral neuropathy.  Renal: Patient does not have known baseline CKD. She is not on an ACEI/ARB at present. Check urine albumin/creatinine ratio yearly starting at time of diagnosis. If albuminuria is positive, treatment is geared toward better glucose, blood pressure control and use of ACE inhibitors or ARBs. Monitor electrolytes and creatinine once to twice yearly.   3) Lipids: Patient is on a statin. LDL below 100  mg/dL.    4) Hypertension: Historically her BP has been fluctuating, she has been keeping a record of  it.    F/u in 6 weeks     I discussed the assessment and treatment plan with the patient. The patient was provided an opportunity to ask questions and all were answered. The patient agreed with the plan and demonstrated an understanding of the instructions.   The patient was advised to call back or seek an in-person evaluation if the symptoms worsen or if the condition fails to improve as anticipated.     Signed electronically by: Mack Guise, MD  [facility]  [facility] [address] [place], [place] [address] Phone: [phone] FAX: [phone]   CC: Libby Maw, [place] [address] Phone: [phone] Fax: [phone]   Return to Endocrinology clinic as below: Future Appointments  Date Time Provider [place]  04/05/2019  9:30 AM Shamleffer, Melanie Crazier, MD [facility] None  04/11/2019  9:00 AM Libby Maw, MD [facility]

## 2019-04-11 ENCOUNTER — Encounter: Payer: Self-pay | Admitting: Family Medicine

## 2019-04-11 ENCOUNTER — Encounter: Payer: Medicare Other | Admitting: Family Medicine

## 2019-04-11 NOTE — Progress Notes (Deleted)
Virtual Visit via Video Note  I connected with Wendy Underwood on 04/11/19 at  9:00 AM EDT by a video enabled telemedicine application and verified that I am speaking with the correct person using two identifiers.  Location: Patient: *** Provider: ***   I discussed the limitations of evaluation and management by telemedicine and the availability of in person appointments. The patient expressed understanding and agreed to proceed.  History of Present Illness:    Observations/Objective:   Assessment and Plan:   Follow Up Instructions:    I discussed the assessment and treatment plan with the patient. The patient was provided an opportunity to ask questions and all were answered. The patient agreed with the plan and demonstrated an understanding of the instructions.   The patient was advised to call back or seek an in-person evaluation if the symptoms worsen or if the condition fails to improve as anticipated.  I provided *** minutes of non-face-to-face time during this encounter.

## 2019-04-13 ENCOUNTER — Ambulatory Visit (INDEPENDENT_AMBULATORY_CARE_PROVIDER_SITE_OTHER): Payer: Medicare Other | Admitting: Family Medicine

## 2019-04-13 ENCOUNTER — Telehealth: Payer: Self-pay | Admitting: Family Medicine

## 2019-04-13 ENCOUNTER — Encounter: Payer: Self-pay | Admitting: Family Medicine

## 2019-04-13 VITALS — Ht 67.0 in

## 2019-04-13 DIAGNOSIS — E1165 Type 2 diabetes mellitus with hyperglycemia: Secondary | ICD-10-CM

## 2019-04-13 DIAGNOSIS — I10 Essential (primary) hypertension: Secondary | ICD-10-CM | POA: Diagnosis not present

## 2019-04-13 DIAGNOSIS — E785 Hyperlipidemia, unspecified: Secondary | ICD-10-CM

## 2019-04-13 DIAGNOSIS — E559 Vitamin D deficiency, unspecified: Secondary | ICD-10-CM

## 2019-04-13 DIAGNOSIS — I6522 Occlusion and stenosis of left carotid artery: Secondary | ICD-10-CM

## 2019-04-13 MED ORDER — LISINOPRIL 10 MG PO TABS: 10.0000 mg | ORAL_TABLET | Freq: Every day | ORAL | 1 refills | Status: AC

## 2019-04-13 NOTE — Telephone Encounter (Signed)
Called patient and left vm to call back to schedule appt with Ethelene Hal

## 2019-04-13 NOTE — Telephone Encounter (Signed)
Copied from [place] [id number]. Topic: Appointment Scheduling - Scheduling Inquiry for Clinic >> Apr 12, 2019  4:57 PM Yvette Rack wrote: Reason for CRM: Pt stated she would like to reschedule the 04/11/19 appt. Pt requests a call back

## 2019-04-13 NOTE — Progress Notes (Signed)
Established Patient Office Visit  Subjective:  Patient ID: Wendy Underwood, female    DOB: [birth year]  Age: 68 y.o. MRN: [id number]  CC:  Chief Complaint  Patient presents with  . Follow-up    HPI Wendy Underwood presents for follow-up of her blood pressure.  Multiple readings at her home have been in the upper 130s over 140 range.  She is currently taking no medications for this.  She did see endocrinology who switched her from [place] to [place] for cost considerations.  This medication has not been felt filled for her yet.  She is compliant with her atorvastatin.  She did see vascular for follow-up of her carotid artery stenosis.  Asked to continue all her medicines as above and follow-up with them in 1 year.  She assures that she has been taking her Plavix daily.  Continues to supplement her vitamin D deficiency with 2000 units.  She is currently out of work secondary to her recent CVA.  There are plans for her to go back to work and work from home exclusively.  She has follow-up scheduled with endocrinology in a few months.  Past Medical History:  Diagnosis Date  . ADD (attention deficit disorder)   . ADHD (attention deficit hyperactivity disorder)   . Anxiety   . Diabetes mellitus   . GERD (gastroesophageal reflux disease)   . Hyperlipidemia   . Menopause   . Stroke [facility]     Past Surgical History:  Procedure Laterality Date  . FOOT SURGERY  2012  . [place]  . HAMMERTOE RECONSTRUCTION WITH WEIL OSTEOTOMY  10/27/2012   Procedure: HAMMERTOE RECONSTRUCTION WITH WEIL OSTEOTOMY;  Surgeon: [name], MD;  Location: [facility];  Service: Orthopedics;  Laterality: Right;  Right Second, Third and Fouth  Hammertoe Correction; Right Second and Third Metatarsal Weil Osteotomy; Second and Third Dorsal Capsulotomy;  Second Solicitor  . KNEE ARTHROSCOPY  2008 and 2009   both knees  . MOUTH SURGERY  2010  . varicose veins  2011and  1991  . WRIST SURGERY     cyst removal    Family History  Problem Relation Age of Onset  . Heart attack Maternal Grandfather   . Hypertension Maternal Grandfather   . Heart disease Maternal Grandfather   . Heart attack Father   . Hyperlipidemia Father   . Heart disease Father   . Cancer Mother        skin  . Schizophrenia Mother   . Depression Mother   . Diabetes Mother   . Hypertension Mother   . Hyperlipidemia Mother   . Cancer Maternal Uncle        skin  . Emphysema Maternal Aunt   . COPD Maternal Aunt   . ADD / ADHD Son     Social History   Socioeconomic History  . Marital status: Divorced    Spouse name: Not on file  . Number of children: Not on file  . Years of education: Not on file  . Highest education level: Not on file  Occupational History  . Not on file  Social Needs  . Financial resource strain: Not on file  . Food insecurity:    Worry: Not on file    Inability: Not on file  . Transportation needs:    Medical: Not on file    Non-medical: Not on file  Tobacco Use  . Smoking status: Former Smoker    Packs/day: 10.50  Years: 30.00    Pack years: 315.00    Types: Cigarettes    Last attempt to quit: 06/02/2007    Years since quitting: 11.8  . Smokeless tobacco: Never Used  Substance and Sexual Activity  . Alcohol use: No  . Drug use: No  . Sexual activity: Never  Lifestyle  . Physical activity:    Days per week: Not on file    Minutes per session: Not on file  . Stress: Not on file  Relationships  . Social connections:    Talks on phone: Not on file    Gets together: Not on file    Attends religious service: Not on file    Active member of club or organization: Not on file    Attends meetings of clubs or organizations: Not on file    Relationship status: Not on file  . Intimate partner violence:    Fear of current or ex partner: Not on file    Emotionally abused: Not on file    Physically abused: Not on file    Forced sexual  activity: Not on file  Other Topics Concern  . Not on file  Social History Narrative  . Not on file    Outpatient Medications Prior to Visit  Medication Sig Dispense Refill  . BIOTIN PO Take by mouth.    . Blood Glucose Monitoring Suppl (TRUE METRIX METER) w/Device KIT Use as directed! 1 kit 0  . Cholecalciferol (VITAMIN D-3) 1000 units CAPS Take 2,000 Units by mouth daily.    . clopidogrel (PLAVIX) 75 MG tablet Take 1 tablet (75 mg total) by mouth daily. 90 tablet 3  . diphenhydrAMINE HCl, Sleep, (ZZZQUIL) 25 MG CAPS Take 50 mg by mouth at bedtime as needed (for sleep).    . empagliflozin (JARDIANCE) 10 MG TABS tablet Take 10 mg by mouth daily. 90 tablet 1  . gabapentin (NEURONTIN) 300 MG capsule Take 1 capsule (300 mg total) by mouth at bedtime. 90 capsule 1  . glipiZIDE (GLUCOTROL) 5 MG tablet Take 1 tablet (5 mg total) by mouth 2 (two) times daily before a meal for 30 days. 180 tablet 2  . glucose blood (ACCU-CHEK AVIVA) test strip Use as instructed 100 each 12  . glucose blood (TRUE METRIX BLOOD GLUCOSE TEST) test strip 1 each by Other route 3 (three) times daily. Use as instructed 100 each 12  . MAGNESIUM PO Take 2 tablets by mouth daily.    . metFORMIN (GLUCOPHAGE) 1000 MG tablet Take 1 tablet (1,000 mg total) by mouth 2 (two) times daily with a meal. 180 tablet 3  . Multiple Vitamin (MULTIVITAMIN) tablet Take 1 tablet by mouth daily.    Marland Kitchen POTASSIUM PO Take 1 tablet by mouth daily.    . TRUEPLUS LANCETS 28G MISC Use TID 100 each 11  . dapagliflozin propanediol (FARXIGA) 10 MG TABS tablet Take 10 mg by mouth daily. 30 tablet 2  . atorvastatin (LIPITOR) 20 MG tablet Take 1 tablet (20 mg total) by mouth daily at 6 PM. (Patient taking differently: Take 20 mg by mouth. ) 90 tablet 3  . omega-3 acid ethyl esters (LOVAZA) 1 g capsule Take 1 capsule (1 g total) by mouth daily at 6 PM. 30 capsule 0   No facility-administered medications prior to visit.     Allergies  Allergen Reactions   . Atorvastatin Other (See Comments)    Muscles hurt  . Statins Other (See Comments)    Muscle aches  ROS Review of Systems  Constitutional: Negative.   Respiratory: Negative.   Cardiovascular: Negative.   Gastrointestinal: Negative.   Musculoskeletal: Negative for arthralgias and myalgias.  Neurological: Negative for speech difficulty, weakness, light-headedness, numbness and headaches.  Psychiatric/Behavioral: Negative.       Objective:    Physical Exam  Ht _0  (1.702 m)   BMI 24.54 kg/m  Wt Readings from Last 3 Encounters:  03/29/19 156 lb 11.2 oz (71.1 kg)  03/15/19 156 lb 3.2 oz (70.9 kg)  11/18/18 155 lb 2 oz (70.4 kg)     Health Maintenance Due  Topic Date Due  . Hepatitis C Screening  09/13/1951  . OPHTHALMOLOGY EXAM  07/26/1963  . COLONOSCOPY  07/26/2003  . FOOT EXAM  05/09/2016  . MAMMOGRAM  05/30/2017  . PAP SMEAR-Modifier  06/30/2018  . DEXA SCAN  07/25/2018  . PNA vac Low Risk Adult (1 of 2 - PCV13) 07/25/2018    There are no preventive care reminders to display for this patient.  Lab Results  Component Value Date   TSH 1.204 06/01/2012   Lab Results  Component Value Date   WBC 9.0 03/10/2019   HGB 17.0 (H) 03/10/2019   HCT 48.4 (H) 03/10/2019   MCV 89.0 03/10/2019   PLT 267.0 03/10/2019   Lab Results  Component Value Date   NA 139 03/10/2019   K 3.8 03/10/2019   CO2 23 03/10/2019   GLUCOSE 275 (H) 03/10/2019   BUN 10 03/10/2019   CREATININE 0.63 03/10/2019   BILITOT 0.8 03/10/2019   ALKPHOS 60 03/10/2019   AST 14 03/10/2019   ALT 15 03/10/2019   PROT 7.8 03/10/2019   ALBUMIN 3.9 03/10/2019   CALCIUM 9.3 03/10/2019   ANIONGAP 8 09/02/2018   GFR 94.65 03/10/2019   Lab Results  Component Value Date   CHOL 130 03/10/2019   Lab Results  Component Value Date   HDL 30.70 (L) 03/10/2019   Lab Results  Component Value Date   LDLCALC 69 03/10/2019   Lab Results  Component Value Date   TRIG 149.0 03/10/2019   Lab  Results  Component Value Date   CHOLHDL 4 03/10/2019   Lab Results  Component Value Date   HGBA1C 10.8 (H) 03/10/2019      Assessment & Plan:   Problem List Items Addressed This Visit      Cardiovascular and Mediastinum   Essential hypertension - Primary   Relevant Medications   lisinopril (ZESTRIL) 10 MG tablet   Stenosis of left carotid artery   Relevant Medications   lisinopril (ZESTRIL) 10 MG tablet     Endocrine   Uncontrolled type 2 diabetes mellitus with hyperglycemia (HCC)   Relevant Medications   lisinopril (ZESTRIL) 10 MG tablet     Other   Dyslipidemia   Vitamin D deficiency   Relevant Orders   VITAMIN D 25 Hydroxy (Vit-D Deficiency, Fractures)      Meds ordered this encounter  Medications  . lisinopril (ZESTRIL) 10 MG tablet    Sig: Take 1 tablet (10 mg total) by mouth daily.    Dispense:  30 tablet    Refill:  1    Follow-up: Return in about 1 month (around 05/14/2019).    Libby Maw, MDVirtual Visit via Video Note  I connected with Wendy Underwood on 04/13/19 at 11:30 AM EDT by a video enabled telemedicine application and verified that I am speaking with the correct person using two identifiers.  Location: Patient: homeProvider:  I discussed the limitations of evaluation and management by telemedicine and the availability of in person appointments. The patient expressed understanding and agreed to proceed.  History of Present Illness:    Observations/Objective:   Assessment and Plan:   Follow Up Instructions:    I discussed the assessment and treatment plan with the patient. The patient was provided an opportunity to ask questions and all were answered. The patient agreed with the plan and demonstrated an understanding of the instructions.   The patient was advised to call back or seek an in-person evaluation if the symptoms worsen or if the condition fails to improve as anticipated.  I provided 20 minutes of  non-face-to-face time during this encounter.    We will start patient on lisinopril 10 mg.  She will look out for cough.  Will check and record her blood pressures as before.  Hopefully she will be able to start the Jardiance.  She has follow-up scheduled with endocrinology in a few months.  She is going to try to start an exercise program walking around her apartment complex in the near future.  She is planning on seeing a psychotherapist for treatment of her needle phobia.

## 2019-04-18 ENCOUNTER — Telehealth: Payer: Self-pay | Admitting: Family Medicine

## 2019-04-18 NOTE — Telephone Encounter (Signed)
Pt came by and dropped off FMLA form for Dr Ethelene Hal to fill out, I put in his folder up front, per pt please call when done and she asked if you could send in fastest way possible.

## 2019-04-18 NOTE — Telephone Encounter (Signed)
Have forms at my desk, will work on them today and have MD sign them. Will notify patient once they are completed.

## 2019-04-18 NOTE — Telephone Encounter (Signed)
I called and spoke with patient. I made her aware that the papers are completed and ready for pick up. Papers placed up front and patient is aware she can come pick them up. Copy made and sent to the scan department.

## 2019-04-19 ENCOUNTER — Telehealth (HOSPITAL_COMMUNITY): Payer: Self-pay | Admitting: Radiology

## 2019-04-19 NOTE — Telephone Encounter (Signed)
Called pt to discuss setting up her cerebral angiogram and possible stent placement with Deveshwar as was originally planned. The patient states that they are "trying different things and does not wish to proceed." States she has seen Dr. Scot Dock and he told her that her stenosis was not "bad enough to treat." She does not wish to schedule with Deveshwar. JM

## 2019-05-16 ENCOUNTER — Ambulatory Visit: Payer: Medicare Other | Admitting: Family Medicine

## 2019-05-17 ENCOUNTER — Ambulatory Visit: Payer: Medicare Other | Admitting: Internal Medicine

## 2019-05-17 NOTE — Progress Notes (Deleted)
Name: Wendy Underwood  Age/ Sex: 68 y.o., female   MRN/ DOB: [id number], 05/12/1951     PCP: Libby Maw, MD   Reason for Endocrinology Evaluation: Type 2 Diabetes Mellitus  Initial Endocrine Consultative Visit: 04/05/2019    PATIENT IDENTIFIER: Wendy Underwood is a 68 y.o. female with a past medical history of T2DM,Neuropathy, Dyslipidemia, and carotid artery stenosis . The patient has followed with Endocrinology clinic since 04/05/2019 for consultative assistance with management of her diabetes.  DIABETIC HISTORY:  Wendy Underwood was diagnosed with T2DM in 2013, she has been on oral glycemic agents since her diagnosis, she has never been on insulin. Her hemoglobin A1c has ranged from 7.1% in 2013, peaking at 11.3% in 2019.   On her initial visit to our clinic, her A1c was 10.8%. She was on glipizide and metformin.   Jardiance started in 03/2019  SUBJECTIVE:   During the last visit (04/05/2019): A1c was 10.8%. We continued Metformin, increased glipizide and started jardiance   Today (05/17/2019): Wendy Underwood is here for a 6 week follow up on diabetes management. She checks her blood sugars *** times daily, preprandial to breakfast and ***. The patient has *** had hypoglycemic episodes since the last clinic visit, which typically occur *** x / - most often occuring ***. The patient is *** symptomatic with these episodes, with symptoms of {symptoms; hypoglycemia:9084048}. Otherwise, the patient has not required any recent emergency interventions for hypoglycemia and has not had recent hospitalizations secondary to hyper or hypoglycemic episodes.    ROS: As per HPI and as detailed below: ROS    HOME DIABETES REGIMEN:  Metformin 1000 mg BID Glipizide to 5 mg BID with meals  Jardiance 10 mg daily     METER DOWNLOAD SUMMARY: Date range evaluated: *** Fingerstick Blood Glucose Tests = ***  Average Number Tests/Day = *** Overall Mean FS Glucose = *** Standard Deviation = ***  BG Ranges: Low = *** High = ***   Hypoglycemic Events/30 Days: BG < 50 = *** Episodes of symptomatic severe hypoglycemia = ***     HISTORY:  Past Medical History:  Past Medical History:  Diagnosis Date  . ADD (attention deficit disorder)   . ADHD (attention deficit hyperactivity disorder)   . Anxiety   . Diabetes mellitus   . GERD (gastroesophageal reflux disease)   . Hyperlipidemia   . Menopause   . Stroke [facility])     Past Surgical History:  Past Surgical History:  Procedure Laterality Date  . FOOT SURGERY  2012  . [place]  . HAMMERTOE RECONSTRUCTION WITH WEIL OSTEOTOMY  10/27/2012   Procedure: HAMMERTOE RECONSTRUCTION WITH WEIL OSTEOTOMY;  Surgeon: [name], MD;  Location: [facility];  Service: Orthopedics;  Laterality: Right;  Right Second, Third and Fouth  Hammertoe Correction; Right Second and Third Metatarsal Weil Osteotomy; Second and Third Dorsal Capsulotomy;  Second Solicitor  . KNEE ARTHROSCOPY  2008 and 2009   both knees  . MOUTH SURGERY  2010  . varicose veins  2011and 1991  . WRIST SURGERY     cyst removal     Social History:  reports that she quit smoking about 11 years ago. Her smoking use included cigarettes. She has a 315.00 pack-year smoking history. She has never used smokeless tobacco. She reports that she does not drink alcohol or use drugs. Family History:  Family History  Problem Relation Age of Onset  . Heart attack Maternal Grandfather   .  Hypertension Maternal Grandfather   . Heart disease Maternal Grandfather   . Heart attack Father   . Hyperlipidemia Father   . Heart disease Father   . Cancer Mother        skin  . Schizophrenia Mother   . Depression Mother   . Diabetes Mother   . Hypertension Mother   . Hyperlipidemia Mother   . Cancer Maternal Uncle        skin  . Emphysema Maternal Aunt    . COPD Maternal Aunt   . ADD / ADHD Son       HOME MEDICATIONS: Allergies as of 05/17/2019      Reactions   Atorvastatin Other (See Comments)   Muscles hurt   Statins Other (See Comments)   Muscle aches      Medication List       Accurate as of May 17, 2019  7:57 AM. If you have any questions, ask your nurse or doctor.        atorvastatin 20 MG tablet Commonly known as: LIPITOR Take 1 tablet (20 mg total) by mouth daily at 6 PM. What changed: when to take this   BIOTIN PO Take by mouth.   clopidogrel 75 MG tablet Commonly known as: PLAVIX Take 1 tablet (75 mg total) by mouth daily.   empagliflozin 10 MG Tabs tablet Commonly known as: Jardiance Take 10 mg by mouth daily.   gabapentin 300 MG capsule Commonly known as: NEURONTIN Take 1 capsule (300 mg total) by mouth at bedtime.   glipiZIDE 5 MG tablet Commonly known as: GLUCOTROL Take 1 tablet (5 mg total) by mouth 2 (two) times daily before a meal for 30 days.   glucose blood test strip Commonly known as: Accu-Chek Aviva Use as instructed   glucose blood test strip Commonly known as: True Metrix Blood Glucose Test 1 each by Other route 3 (three) times daily. Use as instructed   lisinopril 10 MG tablet Commonly known as: ZESTRIL Take 1 tablet (10 mg total) by mouth daily.   MAGNESIUM PO Take 2 tablets by mouth daily.   metFORMIN 1000 MG tablet Commonly known as: GLUCOPHAGE Take 1 tablet (1,000 mg total) by mouth 2 (two) times daily with a meal.   multivitamin tablet Take 1 tablet by mouth daily.   omega-3 acid ethyl esters 1 g capsule Commonly known as: LOVAZA Take 1 capsule (1 g total) by mouth daily at 6 PM.   POTASSIUM PO Take 1 tablet by mouth daily.   True Metrix Meter w/Device Kit Use as directed!   TRUEplus Lancets 28G Misc Use TID   Vitamin D-3 25 MCG (1000 UT) Caps Take 2,000 Units by mouth daily.   ZzzQuil 25 MG Caps Generic drug: diphenhydrAMINE HCl (Sleep) Take 50 mg by  mouth at bedtime as needed (for sleep).        OBJECTIVE:   Vital Signs: There were no vitals taken for this visit.  Wt Readings from Last 3 Encounters:  03/29/19 156 lb 11.2 oz (71.1 kg)  03/15/19 156 lb 3.2 oz (70.9 kg)  11/18/18 155 lb 2 oz (70.4 kg)     Exam: General: Pt appears well and is in NAD  Hydration: Well-hydrated with moist mucous membranes and good skin turgor  HEENT: Head: Unremarkable with good dentition. Oropharynx clear without exudate.  Eyes: External eye exam normal without stare, lid lag or exophthalmos.  EOM intact.  PERRL.  Neck: General: Supple without adenopathy. Thyroid: Thyroid size normal.  No goiter or nodules appreciated. No thyroid bruit.  Lungs: Clear with good BS bilat with no rales, rhonchi, or wheezes  Heart: RRR with normal S1 and S2 and no gallops; no murmurs; no rub  Abdomen: Normoactive bowel sounds, soft, nontender, without masses or organomegaly palpable  Extremities: No pretibial edema. No tremor. Normal strength and motion throughout. See detailed diabetic foot exam below.  Skin: Normal texture and temperature to palpation. No rash noted. No Acanthosis nigricans/skin tags. No lipohypertrophy.  Neuro: MS is good with appropriate affect, pt is alert and Ox3    DM foot exam: Please see diabetic assessment flow-sheet detailed below:           DATA REVIEWED:  Lab Results  Component Value Date   HGBA1C 10.8 (H) 03/10/2019   HGBA1C 8.7 (H) 11/18/2018   HGBA1C 11.3 (H) 09/04/2018   Lab Results  Component Value Date   MICROALBUR 1.9 03/10/2019   LDLCALC 69 03/10/2019   CREATININE 0.63 03/10/2019   Lab Results  Component Value Date   MICRALBCREAT 1.4 03/10/2019     Lab Results  Component Value Date   CHOL 130 03/10/2019   HDL 30.70 (L) 03/10/2019   LDLCALC 69 03/10/2019   LDLDIRECT 84.0 11/18/2018   TRIG 149.0 03/10/2019   CHOLHDL 4 03/10/2019         ASSESSMENT / PLAN / RECOMMENDATIONS:   1) Type {NUMBERS 1 OR  2:522190} Diabetes Mellitus, ***controlled, With *** complications - Most recent A1c of *** %. Goal A1c < *** %.  ***  Plan: MEDICATIONS:  ***  EDUCATION / INSTRUCTIONS:  BG monitoring instructions: Patient is instructed to check her blood sugars *** times a day, ***.  Call [place] if: BG persistently < 70 or > 300. . I reviewed the Rule of 15 for the treatment of hypoglycemia in detail with the patient. Literature supplied.  REFERRALS:  ***.   2) Diabetic complications:   Eye: Does *** have known diabetic retinopathy.   Neuro/ Feet: Does *** have known diabetic peripheral neuropathy .   Renal: Patient does *** have known baseline CKD. She   is *** on an ACEI/ARB at present. Check urine albumin/creatinine ratio yearly starting at time of diagnosis. If albuminuria is positive, treatment is geared toward better glucose, blood pressure control and use of ACE inhibitors or ARBs. Monitor electrolytes and creatinine once to twice yearly.   3) Lipids: Patient is *** on a statin.  4) Hypertension: *** at goal of < 140/90 mmHg.    F/U in ***    Signed electronically by: Mack Guise, MD  [facility]  [facility] [address] [place], [place] [address] Phone: [phone] FAX: [phone]   CC: Libby Maw, MD [place] [address] Phone: [phone]  Fax: [phone]  Return to Endocrinology clinic as below: Future Appointments  Date Time Provider [place]   05/17/2019  9:50 AM Anarie Kalish, Melanie Crazier, MD [facility] None

## 2019-05-21 IMAGING — CT CT ANGIO HEAD
1 of 12 series · 4 of 33 positions shown · IV contrast (APPLIED)
Comparison: MR brain 09/03/2018.

CLINICAL DATA: 67 y.o. female with history of diabetes mellitus,
lipidemia intolerant to statin, ADHD, prior smoker presenting with a
2-day history of gait imbalance and weakness in the right hand. She
did not receive IV t-PA due to late presentation. Multiple acute
infarcts on MR. Abnormal carotid Dopplers.

EXAM:
CT ANGIOGRAPHY HEAD AND NECK
TECHNIQUE: Multidetector CT imaging of the head and neck was performed using
the standard protocol during bolus administration of intravenous
contrast. Multiplanar CT image reconstructions and MIPs were
obtained to evaluate the vascular anatomy. Carotid stenosis
measurements (when applicable) are obtained utilizing NASCET
criteria, using the distal internal carotid diameter as the
denominator.
CONTRAST:  50mL GNNKK4-K8R IOPAMIDOL (GNNKK4-K8R) INJECTION 76%

[Series 11: ax thins · axial · 0.39mm/px · z∈[-206,-8]mm · 4 of 329 slices shown]
[im 66/329  soft-tissue]
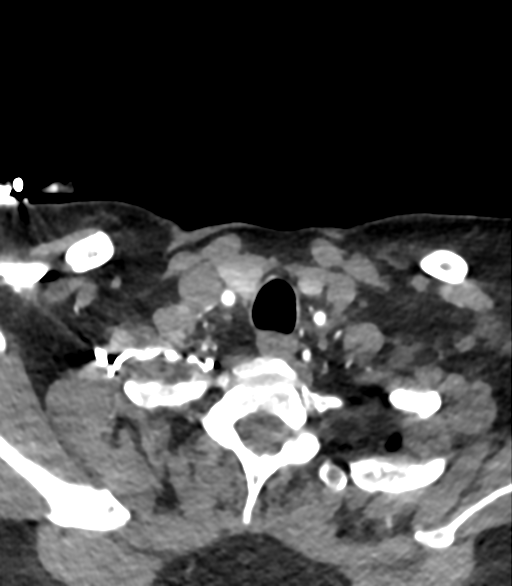
[im 132/329  bone]
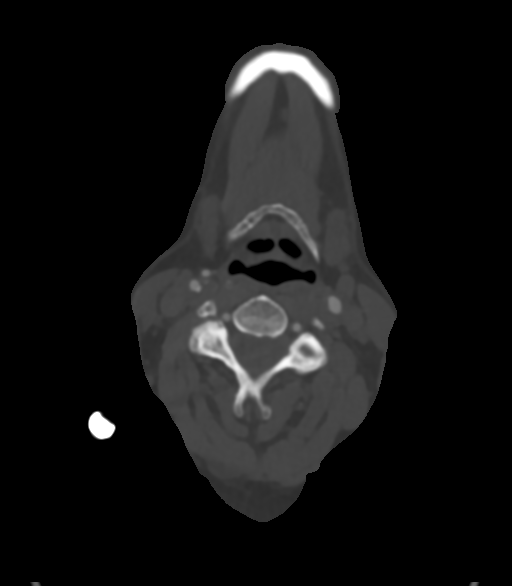
[im 197/329  soft-tissue]
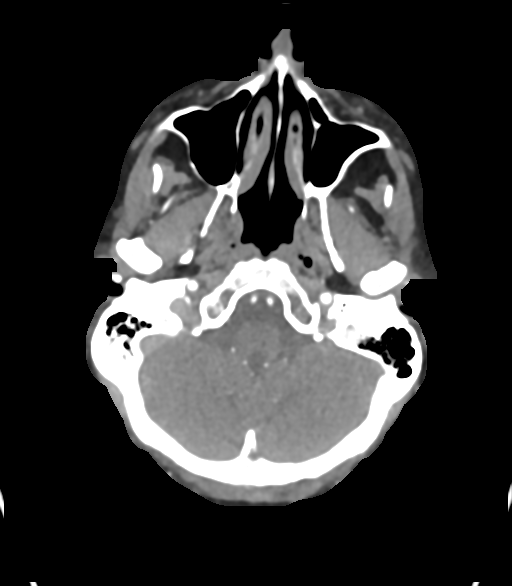
[im 263/329  bone]
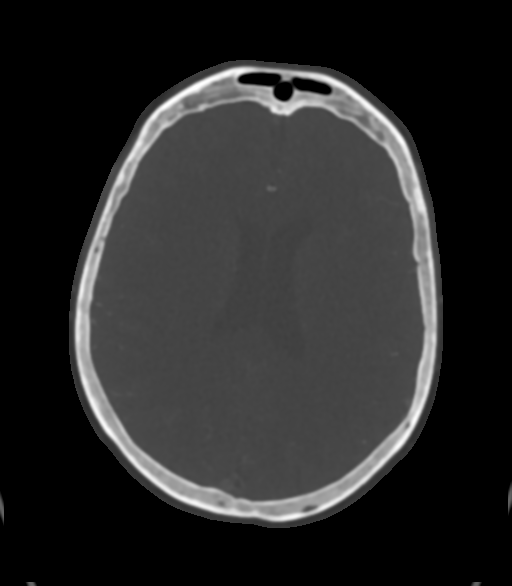

[4 of 33 positions shown; findings below may reference images not displayed]

FINDINGS: CT HEAD FINDINGS

Brain: Increasing conspicuity LEFT centrum semiovale infarct,
cytotoxic edema. Other smaller infarcts are poorly visualized.

Vascular: Reported separately.

Skull: Normal. Negative for fracture or focal lesion.

Sinuses: Imaged portions are clear.

Orbits: No acute finding.

Review of the MIP images confirms the above findings

CTA NECK FINDINGS

Aortic arch: Standard branching. Imaged portion shows no evidence of
aneurysm or dissection. No significant stenosis of the major arch
vessel origins.

Right carotid system: No evidence of dissection, stenosis (50% or
greater) or occlusion. Calcified and noncalcified plaque at the
bifurcation.

Left carotid system: No evidence of dissection, stenosis (50% or
greater) or occlusion. Noncalcified plaque at the bifurcation.

Vertebral arteries: Essentially codominant. No evidence of
dissection, stenosis (50% or greater) or occlusion.

Skeleton: Spondylosis.  Poor dentition.

Other neck: No masses. Airway patent. Subcentimeter calcified RIGHT
thyroid lesion.

Upper chest: No mass or pneumothorax.

Review of the MIP images confirms the above findings

CTA HEAD FINDINGS

Anterior circulation: No flow-limiting stenosis, proximal occlusion,
aneurysm, or vascular malformation of the RIGHT ICA. In the
cavernous segment of the LEFT ICA there is high-grade stenosis, flow
reducing, estimated 75%. See series 12, image 80 series 13, image
121, series 11, image 111. Remainder of the LEFT ICA widely patent.
Mild narrowing distal RIGHT M1 MCA, estimated 50%. No similar
narrowing on the LEFT. No M2 or M3 branch occlusion.

Posterior circulation: No significant stenosis, proximal occlusion,
aneurysm, or vascular malformation. LEFT vertebral dominant
contributor. 50% stenosis distal RIGHT vertebral prior to basilar
confluence. No PCA stenosis or cerebellar branch occlusion.

Venous sinuses: As permitted by contrast timing, patent.

Anatomic variants: None of significance

Delayed phase: No abnormal intracranial enhancement.

Review of the MIP images confirms the above findings
IMPRESSION: No flow-limiting extracranial stenosis is observed. Mild nonstenotic
irregularity of both carotid bifurcations.

Flow-limiting stenosis of the cavernous segment LEFT ICA, estimated
75%. Otherwise no intracranial anterior circulation stenoses or
dissection.

No abnormal postcontrast enhancement.

## 2019-06-20 ENCOUNTER — Telehealth: Payer: Self-pay | Admitting: Family Medicine

## 2019-06-20 NOTE — Telephone Encounter (Signed)
[place] [facility] [place] service cam by and dropped off a death certification for this patient. Certificate will be in the doctors folder at the front. Please call for pick up when forms are filled out.

## 2019-06-21 NOTE — Telephone Encounter (Signed)
Noted, death certificate placed on provider's desk to be signed.

## 2019-06-21 NOTE — Telephone Encounter (Signed)
Butch Penny from [facility] called saying that death certificate signature is needed before medical examiner can come. Pt found dead 2019-06-14, body is filled with maggots. Butch Penny states signed certificate can be faxed.  Fax: [phone]

## 2019-06-24 NOTE — Telephone Encounter (Signed)
Certificate signed & given to the front desk.

## 2019-06-24 NOTE — Telephone Encounter (Signed)
Certificate has been faxed over and original is at the front to be picked up

## 2019-06-24 DEATH — deceased

## 2019-09-29 NOTE — Progress Notes (Signed)
This encounter was created in error - please disregard.
